# Patient Record
Sex: Female | Born: 1945 | Race: White | Hispanic: No | Smoking: Current every day smoker
Health system: Southern US, Community
[De-identification: ages and names within clinical notes are randomized; demographics above are authoritative.]

## PROBLEM LIST (undated history)

## (undated) DIAGNOSIS — Z860100 Personal history of colon polyps, unspecified: Secondary | ICD-10-CM

## (undated) DIAGNOSIS — K5792 Diverticulitis of intestine, part unspecified, without perforation or abscess without bleeding: Secondary | ICD-10-CM

## (undated) DIAGNOSIS — E079 Disorder of thyroid, unspecified: Secondary | ICD-10-CM

## (undated) DIAGNOSIS — R011 Cardiac murmur, unspecified: Secondary | ICD-10-CM

## (undated) DIAGNOSIS — E119 Type 2 diabetes mellitus without complications: Secondary | ICD-10-CM

## (undated) DIAGNOSIS — Z973 Presence of spectacles and contact lenses: Secondary | ICD-10-CM

## (undated) DIAGNOSIS — F32A Depression, unspecified: Secondary | ICD-10-CM

## (undated) DIAGNOSIS — Z87442 Personal history of urinary calculi: Secondary | ICD-10-CM

## (undated) DIAGNOSIS — I10 Essential (primary) hypertension: Secondary | ICD-10-CM

## (undated) DIAGNOSIS — M1990 Unspecified osteoarthritis, unspecified site: Secondary | ICD-10-CM

## (undated) HISTORY — DX: Depression, unspecified: F32.A

## (undated) HISTORY — DX: Personal history of colon polyps, unspecified: Z86.0100

## (undated) HISTORY — DX: Personal history of colonic polyps: Z86.010

## (undated) HISTORY — PX: TONSILLECTOMY: SUR1361

## (undated) HISTORY — PX: ABDOMINAL HYSTERECTOMY: SHX81

## (undated) HISTORY — PX: CHOLECYSTECTOMY: SHX55

---

## 2001-10-11 HISTORY — PX: CHOLECYSTECTOMY: SHX55

## 2001-11-29 ENCOUNTER — Emergency Department (HOSPITAL_COMMUNITY): Admission: EM | Admit: 2001-11-29 | Discharge: 2001-11-29 | Payer: Self-pay | Admitting: *Deleted

## 2001-12-02 ENCOUNTER — Observation Stay (HOSPITAL_COMMUNITY): Admission: RE | Admit: 2001-12-02 | Discharge: 2001-12-03 | Payer: Self-pay | Admitting: General Surgery

## 2001-12-02 ENCOUNTER — Encounter (INDEPENDENT_AMBULATORY_CARE_PROVIDER_SITE_OTHER): Payer: Self-pay | Admitting: Specialist

## 2001-12-02 ENCOUNTER — Encounter: Payer: Self-pay | Admitting: General Surgery

## 2003-03-19 ENCOUNTER — Encounter: Payer: Self-pay | Admitting: Emergency Medicine

## 2003-03-19 ENCOUNTER — Emergency Department (HOSPITAL_COMMUNITY): Admission: EM | Admit: 2003-03-19 | Discharge: 2003-03-19 | Payer: Self-pay | Admitting: Emergency Medicine

## 2005-02-24 ENCOUNTER — Ambulatory Visit: Payer: Self-pay | Admitting: Orthopedic Surgery

## 2006-06-08 ENCOUNTER — Encounter (INDEPENDENT_AMBULATORY_CARE_PROVIDER_SITE_OTHER): Payer: Self-pay | Admitting: *Deleted

## 2006-06-08 ENCOUNTER — Ambulatory Visit (HOSPITAL_COMMUNITY): Admission: RE | Admit: 2006-06-08 | Discharge: 2006-06-08 | Payer: Self-pay | Admitting: Gastroenterology

## 2009-09-16 ENCOUNTER — Encounter: Admission: RE | Admit: 2009-09-16 | Discharge: 2009-10-08 | Payer: Self-pay | Admitting: Family Medicine

## 2010-11-01 ENCOUNTER — Encounter: Payer: Self-pay | Admitting: Gastroenterology

## 2011-02-26 NOTE — Op Note (Signed)
[name], [name]                 ACCOUNT NO.:  [id number]   MEDICAL RECORD NO.:  [id number]          PATIENT TYPE:  AMB   LOCATION:  ENDO                         FACILITY:  [facility]   PHYSICIAN:  Anselmo Rod, M.D.  DATE OF BIRTH:  [birth year]   DATE OF PROCEDURE:  06/08/2006  DATE OF DISCHARGE:                                 OPERATIVE REPORT   PROCEDURE PERFORMED:  Colonoscopy with cold biopsies x4.  .   ENDOSCOPIST:  Anselmo Rod, M.D.   INSTRUMENT USED:  Olympus video colonoscope.   INDICATIONS FOR PROCEDURE:  A 65 year old white female underwent a screening  colonoscopy.  The patient has a history of colon cancer in her father  diagnosed at age 74 and has had recent change in bowel habits with mucoid  stools and abdominal pain.  Rule out colonic polyps, masses, etc.  She is  presumed to have diverticulitis.   PRE-PROCEDURE PREPARATION:  Informed consent was procured from the patient.  The patient was fasted for four hours prior to the procedure and prepped  with 32 OsmoPrep pills, 20 of which were given the night prior to the  procedure and 12 the morning of the procedure.  Risks and benefits of the  procedure were discussed with the patient in great detail.  A 10% missed  rate of cancer and polyps were discussed with her as well.   PRE-PROCEDURE PHYSICAL EXAMINATION:  VITAL SIGNS:  Stable vital signs.  NECK:  Supple.  CHEST:  Clear to auscultation.  HEART:  S1 and S2.  Regular.  ABDOMEN:  Soft with normal bowel sounds.   DESCRIPTION OF PROCEDURE:  The patient was placed in the left lateral  decubitus position and sedated with 150 mcg of fentanyl and 10 mg of Versed  in slow, incremental doses.  Once the patient was adequately sedated and  maintained on low flow oxygen and continuous cardiac monitoring, the Olympus  video colonoscope was advanced from the rectum to the cecum.  The appendicle  orifices and ileocecal valve were clearly visualized and photographed.  Multiple washings were done.  The terminal ileum appeared healthy and  without lesions.  The patient's position was changed from the left lateral  to supine and the right lateral position to reach the cecal base.  Because  of the patient's body habitus, the procedure was technically difficult.  Three small sessile polyps were biopsied from the rectum (cold biopsies x4).  The patient tolerated the procedure well without complications.   IMPRESSION:  1. Small nonbleeding internal hemorrhoid.  2. Three small sessile polyps biopsied from the rectum.  3. No evidence of diverticulosis.  4. Otherwise normal exam up to the terminal ileum.   RECOMMENDATIONS:  1. Await pathology results.  2. An abdominal ultrasound and transvaginal ultrasound will be done to      rule out ovarian pathology.  3. Avoid all nonsteroidals, including aspirin, for the next two weeks.  4. Repeat colonoscopy, depending on pathology results.  5. Outpatient followup after the ultrasound has been done for further      recommendations.  Anselmo Rod, M.D.  Electronically Signed     JNM/MEDQ  D:  06/08/2006  T:  06/08/2006  Job:  [id number]   cc:   Teena Irani. Arlyce Dice, M.D.

## 2014-07-09 ENCOUNTER — Other Ambulatory Visit: Payer: Self-pay

## 2014-12-17 ENCOUNTER — Other Ambulatory Visit (HOSPITAL_COMMUNITY): Payer: Self-pay | Admitting: Family Medicine

## 2014-12-17 ENCOUNTER — Ambulatory Visit (HOSPITAL_COMMUNITY)
Admission: RE | Admit: 2014-12-17 | Discharge: 2014-12-17 | Disposition: A | Discharge status: Home / Self Care | Payer: PPO | Source: Ambulatory Visit | Attending: Family Medicine | Admitting: Family Medicine

## 2014-12-17 ENCOUNTER — Other Ambulatory Visit: Payer: Self-pay | Admitting: Family Medicine

## 2014-12-17 DIAGNOSIS — R10813 Right lower quadrant abdominal tenderness: Secondary | ICD-10-CM

## 2014-12-17 DIAGNOSIS — R10829 Rebound abdominal tenderness, unspecified site: Secondary | ICD-10-CM

## 2014-12-17 DIAGNOSIS — R198 Other specified symptoms and signs involving the digestive system and abdomen: Secondary | ICD-10-CM

## 2014-12-17 LAB — POCT I-STAT CREATININE: Creatinine, Ser: 0.9 mg/dL (ref 0.50–1.10)

## 2014-12-17 MED ORDER — IOHEXOL 300 MG/ML  SOLN
50.0000 mL | Freq: Once | INTRAMUSCULAR | Status: AC | PRN
  Administered 2014-12-17: 50 mL via ORAL

## 2014-12-17 MED ORDER — IOHEXOL 300 MG/ML  SOLN
100.0000 mL | Freq: Once | INTRAMUSCULAR | Status: AC | PRN
  Administered 2014-12-17: 100 mL via INTRAVENOUS

## 2014-12-25 ENCOUNTER — Other Ambulatory Visit (HOSPITAL_COMMUNITY): Payer: Self-pay | Admitting: Family Medicine

## 2014-12-25 DIAGNOSIS — N2889 Other specified disorders of kidney and ureter: Secondary | ICD-10-CM

## 2015-01-03 ENCOUNTER — Ambulatory Visit (HOSPITAL_COMMUNITY)
Admission: RE | Admit: 2015-01-03 | Discharge: 2015-01-03 | Disposition: A | Discharge status: Home / Self Care | Payer: PPO | Source: Ambulatory Visit | Attending: Family Medicine | Admitting: Family Medicine

## 2015-01-03 DIAGNOSIS — N2889 Other specified disorders of kidney and ureter: Secondary | ICD-10-CM | POA: Diagnosis present

## 2015-01-03 MED ORDER — GADOBENATE DIMEGLUMINE 529 MG/ML IV SOLN
20.0000 mL | Freq: Once | INTRAVENOUS | Status: AC | PRN
  Administered 2015-01-03: 20 mL via INTRAVENOUS

## 2015-08-04 ENCOUNTER — Other Ambulatory Visit: Payer: Self-pay | Admitting: Family Medicine

## 2015-08-04 DIAGNOSIS — E2839 Other primary ovarian failure: Secondary | ICD-10-CM

## 2015-09-03 ENCOUNTER — Other Ambulatory Visit: Payer: PPO

## 2015-10-09 ENCOUNTER — Ambulatory Visit
Admission: RE | Admit: 2015-10-09 | Discharge: 2015-10-09 | Disposition: A | Discharge status: Home / Self Care | Payer: PPO | Source: Ambulatory Visit | Attending: Family Medicine | Admitting: Family Medicine

## 2015-10-09 DIAGNOSIS — E2839 Other primary ovarian failure: Secondary | ICD-10-CM

## 2015-10-17 DIAGNOSIS — E139 Other specified diabetes mellitus without complications: Secondary | ICD-10-CM | POA: Diagnosis not present

## 2015-10-17 DIAGNOSIS — E1165 Type 2 diabetes mellitus with hyperglycemia: Secondary | ICD-10-CM | POA: Diagnosis not present

## 2015-10-17 DIAGNOSIS — E039 Hypothyroidism, unspecified: Secondary | ICD-10-CM | POA: Diagnosis not present

## 2015-10-17 DIAGNOSIS — E7800 Pure hypercholesterolemia, unspecified: Secondary | ICD-10-CM | POA: Diagnosis not present

## 2015-11-28 DIAGNOSIS — F3341 Major depressive disorder, recurrent, in partial remission: Secondary | ICD-10-CM | POA: Insufficient documentation

## 2015-11-28 DIAGNOSIS — E039 Hypothyroidism, unspecified: Secondary | ICD-10-CM | POA: Insufficient documentation

## 2015-11-28 DIAGNOSIS — E669 Obesity, unspecified: Secondary | ICD-10-CM | POA: Insufficient documentation

## 2015-11-28 DIAGNOSIS — M5430 Sciatica, unspecified side: Secondary | ICD-10-CM | POA: Insufficient documentation

## 2015-11-28 DIAGNOSIS — M6790 Unspecified disorder of synovium and tendon, unspecified site: Secondary | ICD-10-CM | POA: Insufficient documentation

## 2015-11-28 DIAGNOSIS — N2889 Other specified disorders of kidney and ureter: Secondary | ICD-10-CM | POA: Insufficient documentation

## 2015-11-28 DIAGNOSIS — F17200 Nicotine dependence, unspecified, uncomplicated: Secondary | ICD-10-CM | POA: Insufficient documentation

## 2015-11-28 DIAGNOSIS — B349 Viral infection, unspecified: Secondary | ICD-10-CM | POA: Diagnosis not present

## 2016-01-29 DIAGNOSIS — R194 Change in bowel habit: Secondary | ICD-10-CM | POA: Diagnosis not present

## 2016-01-29 DIAGNOSIS — K219 Gastro-esophageal reflux disease without esophagitis: Secondary | ICD-10-CM | POA: Diagnosis not present

## 2016-01-29 DIAGNOSIS — Z1211 Encounter for screening for malignant neoplasm of colon: Secondary | ICD-10-CM | POA: Diagnosis not present

## 2016-01-29 DIAGNOSIS — R197 Diarrhea, unspecified: Secondary | ICD-10-CM | POA: Diagnosis not present

## 2016-01-29 DIAGNOSIS — E669 Obesity, unspecified: Secondary | ICD-10-CM | POA: Diagnosis not present

## 2016-03-15 DIAGNOSIS — E1165 Type 2 diabetes mellitus with hyperglycemia: Secondary | ICD-10-CM | POA: Diagnosis not present

## 2016-03-16 DIAGNOSIS — E039 Hypothyroidism, unspecified: Secondary | ICD-10-CM | POA: Diagnosis not present

## 2016-03-16 DIAGNOSIS — E7800 Pure hypercholesterolemia, unspecified: Secondary | ICD-10-CM | POA: Diagnosis not present

## 2016-03-16 DIAGNOSIS — E1165 Type 2 diabetes mellitus with hyperglycemia: Secondary | ICD-10-CM | POA: Diagnosis not present

## 2016-03-16 DIAGNOSIS — E139 Other specified diabetes mellitus without complications: Secondary | ICD-10-CM | POA: Diagnosis not present

## 2016-03-25 DIAGNOSIS — R053 Chronic cough: Secondary | ICD-10-CM | POA: Insufficient documentation

## 2016-03-25 DIAGNOSIS — R05 Cough: Secondary | ICD-10-CM | POA: Diagnosis not present

## 2016-04-28 DIAGNOSIS — R197 Diarrhea, unspecified: Secondary | ICD-10-CM | POA: Diagnosis not present

## 2016-04-28 DIAGNOSIS — R05 Cough: Secondary | ICD-10-CM | POA: Diagnosis not present

## 2016-05-25 ENCOUNTER — Institutional Professional Consult (permissible substitution): Payer: PPO | Admitting: Internal Medicine

## 2016-06-09 DIAGNOSIS — Z1211 Encounter for screening for malignant neoplasm of colon: Secondary | ICD-10-CM | POA: Diagnosis not present

## 2016-06-09 DIAGNOSIS — Z800 Family history of malignant neoplasm of digestive organs: Secondary | ICD-10-CM | POA: Diagnosis not present

## 2016-06-09 DIAGNOSIS — K635 Polyp of colon: Secondary | ICD-10-CM | POA: Diagnosis not present

## 2016-06-09 DIAGNOSIS — D125 Benign neoplasm of sigmoid colon: Secondary | ICD-10-CM | POA: Diagnosis not present

## 2016-07-08 DIAGNOSIS — E1165 Type 2 diabetes mellitus with hyperglycemia: Secondary | ICD-10-CM | POA: Diagnosis not present

## 2016-07-08 DIAGNOSIS — E7800 Pure hypercholesterolemia, unspecified: Secondary | ICD-10-CM | POA: Diagnosis not present

## 2016-07-08 DIAGNOSIS — E039 Hypothyroidism, unspecified: Secondary | ICD-10-CM | POA: Diagnosis not present

## 2016-07-12 IMAGING — CT CT ABD-PELV W/ CM
2 of 5 series · 16 of 46 positions shown, 18 images · IV contrast (omnipaque)
Comparison: None available.

CLINICAL DATA: 68-year-old diabetic female with abdominal tendinous
in the right lower quadrant for past day. Question appendicitis.
Prior cholecystectomy. Initial encounter.

EXAM:
CT ABDOMEN AND PELVIS WITH CONTRAST
TECHNIQUE: Multidetector CT imaging of the abdomen and pelvis was performed
using the standard protocol following bolus administration of
intravenous contrast.
CONTRAST:  100mL OMNIPAQUE IOHEXOL 300 MG/ML  SOLN

[Series 2: rtn ap with st · axial · 0.88mm/px · z∈[-495,-90]mm · 13 of 91 slices shown, 15 images]
[im 5/91  soft-tissue]
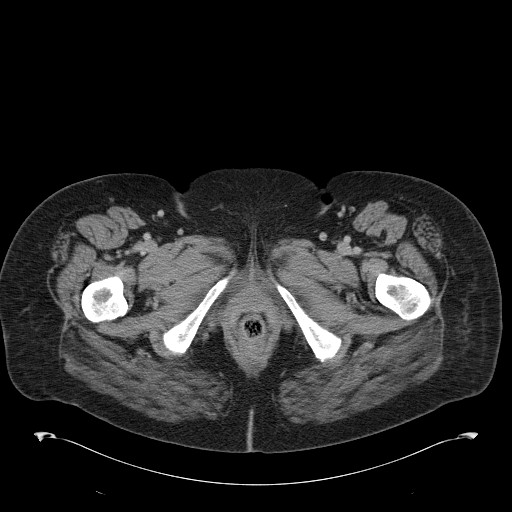
[im 5/91  bone]
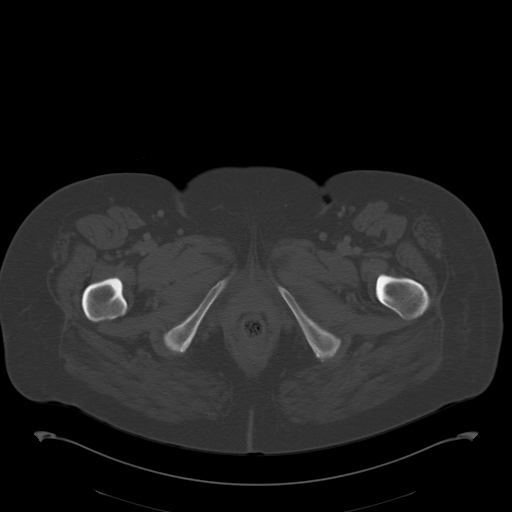
[im 15/91  soft-tissue]
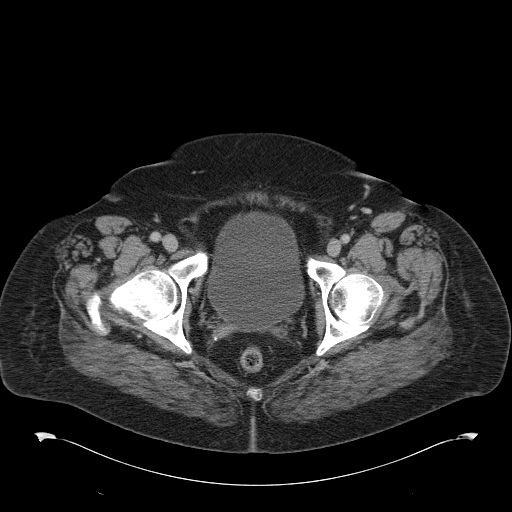
[im 19/91  soft-tissue]
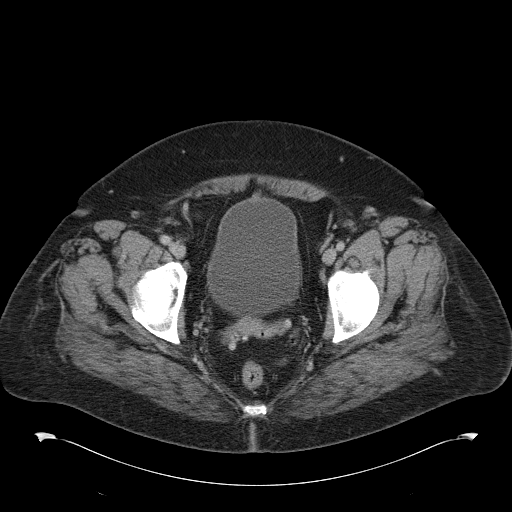
[im 24/91  soft-tissue]
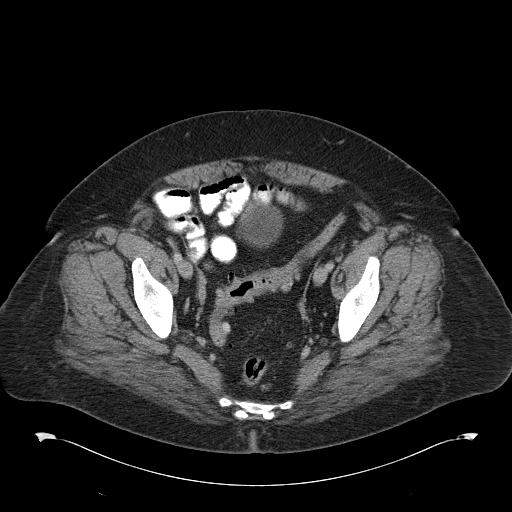
[im 34/91  soft-tissue]
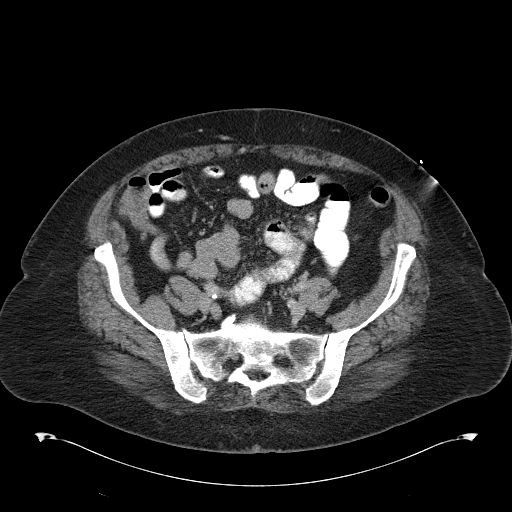
[im 38/91  soft-tissue]
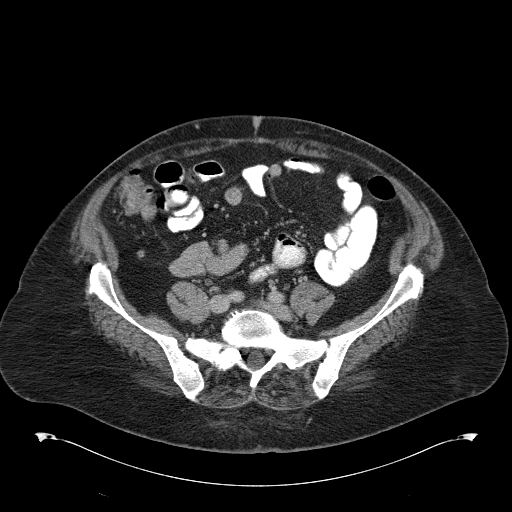
[im 48/91  soft-tissue]
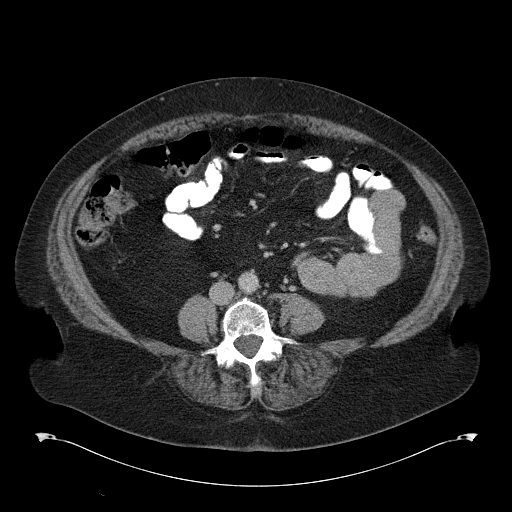
[im 53/91  soft-tissue]
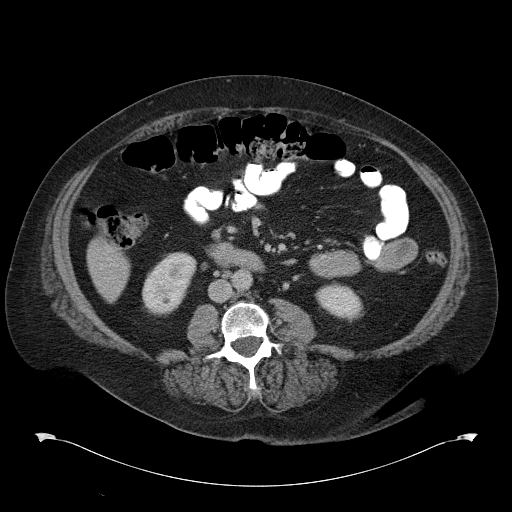
[im 57/91  soft-tissue]
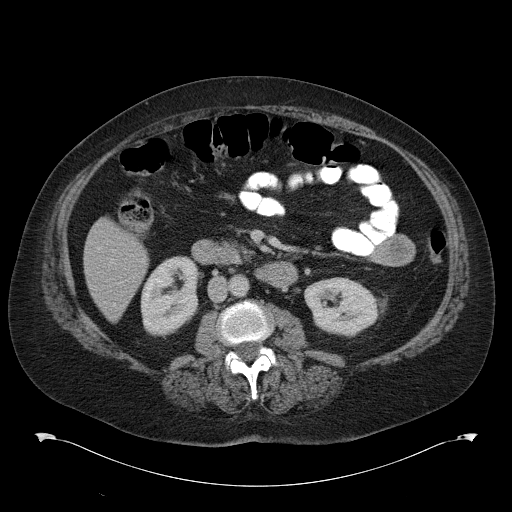
[im 57/91  bone]
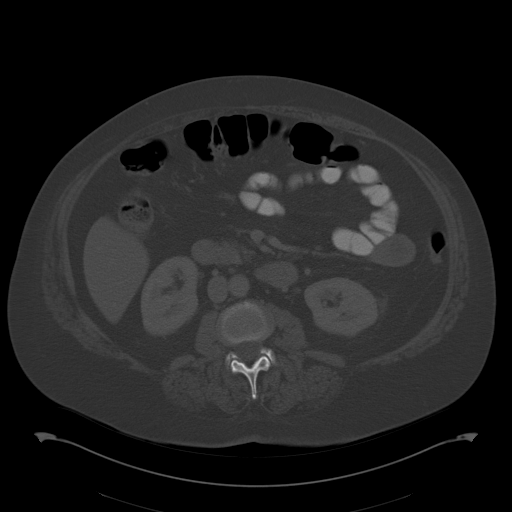
[im 67/91  soft-tissue]
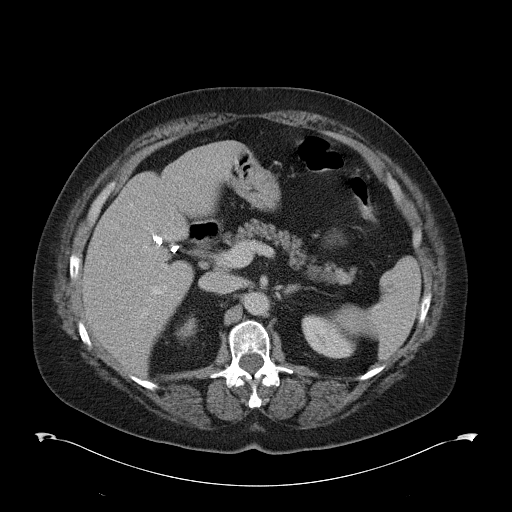
[im 72/91  soft-tissue]
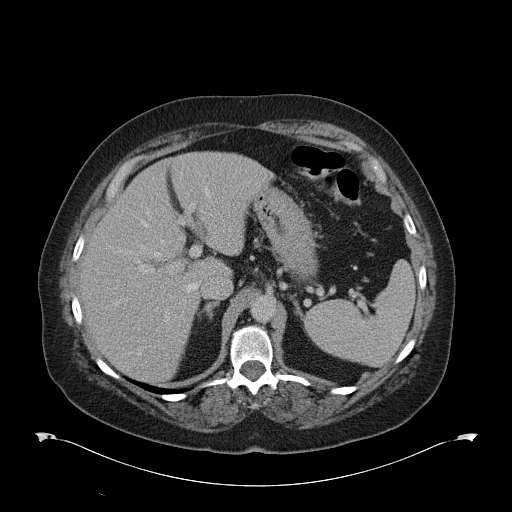
[im 76/91  soft-tissue]
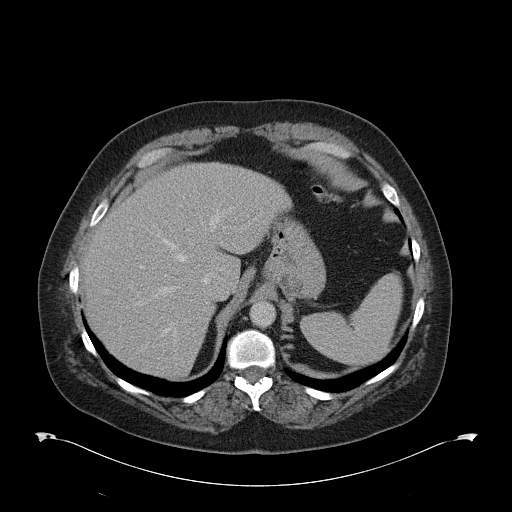
[im 86/91  soft-tissue]
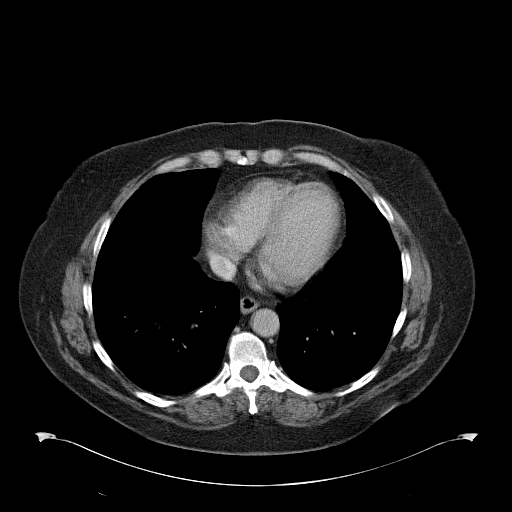

[Series 602: coronals · coronal · 0.92mm/px · 3 of 158 slices shown]
[im 53/158  soft-tissue]
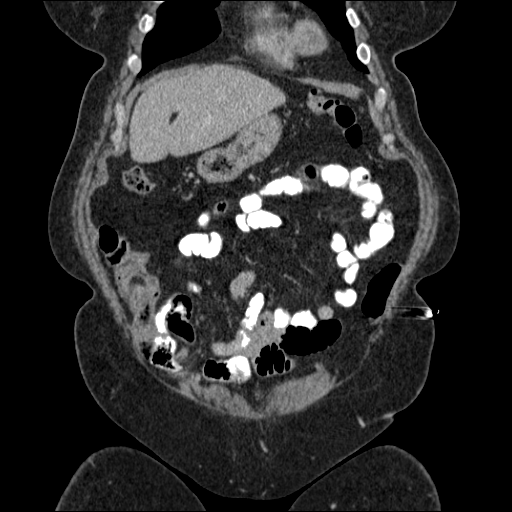
[im 70/158  soft-tissue]
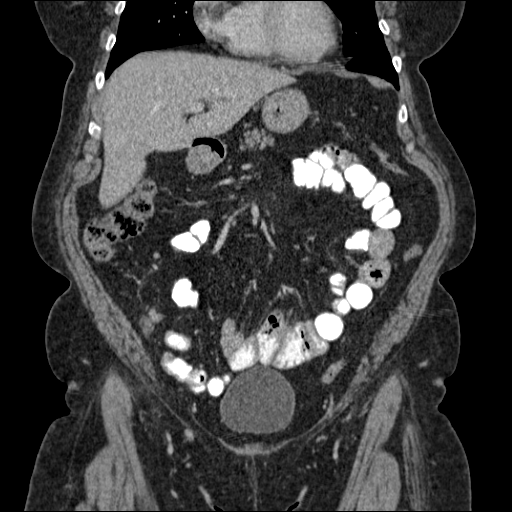
[im 88/158  soft-tissue]
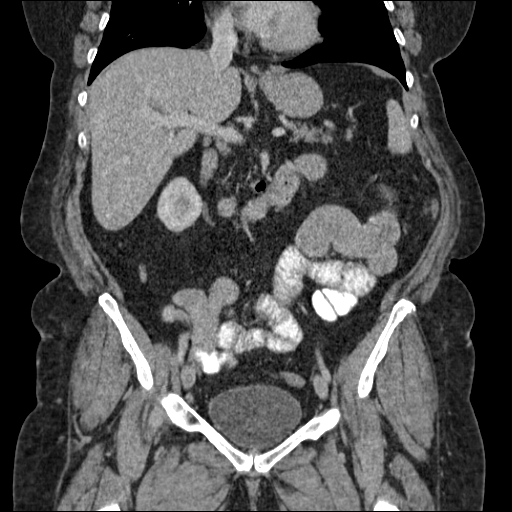

[16 of 46 positions shown; findings below may reference images not displayed]

FINDINGS: Colonic diverticula most notable sigmoid colon. No extra luminal
bowel inflammatory process, free fluid or free air. Specifically, no
inflammation surrounds the appendix or terminal ileum.

Right renal 1.1 cm partially exophytic lesion does not have
characteristics of a simple cyst. Dedicated pre and postcontrast MR
of the kidneys recommended for further delineation to determine if
this represents a suspicious mass. 5 mm right lower pole
angiomyelolipoma suspect.

7.9 mm splenic artery aneurysm suspected.

Mild fatty infiltration of the liver without worrisome focal hepatic
lesion. Post cholecystectomy. No worrisome splenic, pancreatic, left
renal or adrenal abnormality.

Mild atherosclerotic type changes of the abdominal aorta are without
aneurysmal dilation.

Post hysterectomy.

Lung bases clear.

No adenopathy.

No bowel containing hernia.

Mild degenerative changes lower thoracic and lumbar spine most
notable L5-S1.

Non contrast filled views of the urinary bladder unremarkable.
IMPRESSION: Colonic diverticula most notable sigmoid colon. No extra luminal
bowel inflammatory process, free fluid or free air. Specifically, no
inflammation surrounds the appendix or terminal ileum.

Right renal 1.1 cm partially exophytic lesion does not have
characteristics of a simple cyst. Dedicated pre and postcontrast MR
of the kidneys recommended for further delineation to determine if
this represents a suspicious mass. 5 mm right lower pole
angiomyelolipoma suspect.

7.9 mm splenic artery aneurysm suspected.

These results will be called to the ordering clinician or
representative by the Radiologist Assistant, and communication
documented in the PACS or zVision Dashboard.

## 2016-07-15 DIAGNOSIS — E039 Hypothyroidism, unspecified: Secondary | ICD-10-CM | POA: Diagnosis not present

## 2016-07-15 DIAGNOSIS — E139 Other specified diabetes mellitus without complications: Secondary | ICD-10-CM | POA: Diagnosis not present

## 2016-07-15 DIAGNOSIS — E7800 Pure hypercholesterolemia, unspecified: Secondary | ICD-10-CM | POA: Diagnosis not present

## 2016-07-15 DIAGNOSIS — Z23 Encounter for immunization: Secondary | ICD-10-CM | POA: Diagnosis not present

## 2016-07-15 DIAGNOSIS — E1165 Type 2 diabetes mellitus with hyperglycemia: Secondary | ICD-10-CM | POA: Diagnosis not present

## 2016-09-03 ENCOUNTER — Ambulatory Visit (HOSPITAL_COMMUNITY)
Admission: EM | Admit: 2016-09-03 | Discharge: 2016-09-03 | Disposition: A | Discharge status: Home / Self Care | Payer: PPO | Attending: Emergency Medicine | Admitting: Emergency Medicine

## 2016-09-03 ENCOUNTER — Encounter (HOSPITAL_COMMUNITY): Payer: Self-pay

## 2016-09-03 DIAGNOSIS — M79671 Pain in right foot: Secondary | ICD-10-CM | POA: Diagnosis not present

## 2016-09-03 HISTORY — DX: Disorder of thyroid, unspecified: E07.9

## 2016-09-03 HISTORY — DX: Essential (primary) hypertension: I10

## 2016-09-03 HISTORY — DX: Type 2 diabetes mellitus without complications: E11.9

## 2016-09-03 NOTE — ED Provider Notes (Signed)
[place]    CSN: [id number] Arrival date & time: 09/03/16  1153     History   Chief Complaint Chief Complaint  Patient presents with  . Foot Pain    HPI Patricia Mills is a 70 y.o. female.   HPI  She is a 70 year old woman here for evaluation of right foot pain. This started 3 days ago. She denies any injury or trauma. Pain is located in the forefoot, over the metatarsals. She describes it as a pressure sensation. The pain will sometimes radiate up the back of her leg to the knee. She does have some swelling in the right leg.  Past Medical History:  Diagnosis Date  . Diabetes mellitus without complication ([place])   . Hypertension   . Thyroid disease     There are no active problems to display for this patient.   Past Surgical History:  Procedure Laterality Date  . ABDOMINAL HYSTERECTOMY    . CHOLECYSTECTOMY    . TONSILLECTOMY      OB History    No data available       Home Medications    Prior to Admission medications   Medication Sig Start Date End Date Taking? Authorizing Provider  aspirin 81 MG chewable tablet Chew by mouth daily.   Yes Historical Provider, MD  esomeprazole (NEXIUM) 20 MG packet Take 20 mg by mouth daily before breakfast.   Yes Historical Provider, MD  insulin NPH-regular Human (NOVOLIN 70/30) (70-30) 100 UNIT/ML injection Inject into the skin.   Yes Historical Provider, MD  levothyroxine (SYNTHROID, LEVOTHROID) 25 MCG tablet Take 25 mcg by mouth daily before breakfast.   Yes Historical Provider, MD  LORazepam (ATIVAN) 0.5 MG tablet Take 0.5 mg by mouth every 8 (eight) hours.   Yes Historical Provider, MD  losartan (COZAAR) 50 MG tablet Take 50 mg by mouth daily.   Yes Historical Provider, MD  sertraline (ZOLOFT) 50 MG tablet Take 50 mg by mouth daily.   Yes Historical Provider, MD  sitaGLIPtin-metformin (JANUMET) 50-500 MG tablet Take 1 tablet by mouth 2 (two) times daily with a meal.   Yes Historical Provider, MD    Family  History No family history on file.  Social History Social History  Substance Use Topics  . Smoking status: Current Every Day Smoker    Packs/day: 0.50    Years: 20.00    Types: Cigarettes  . Smokeless tobacco: Never Used  . Alcohol use No     Allergies   Patient has no known allergies.   Review of Systems Review of Systems As in history of present illness  Physical Exam Triage Vital Signs ED Triage Vitals  Enc Vitals Group     BP 09/03/16 1312 151/73     Pulse Rate 09/03/16 1312 70     Resp 09/03/16 1312 16     Temp 09/03/16 1312 98.1 F (36.7 C)     Temp Source 09/03/16 1312 Oral     SpO2 09/03/16 1312 95 %     Weight --      Height --      Head Circumference --      Peak Flow --      Pain Score 09/03/16 1316 5     Pain Loc --      Pain Edu? --      Excl. in [place]? --    No data found.   Updated Vital Signs BP 151/73 (BP Location: Left Arm)   Pulse 70  Temp 98.1 F (36.7 C) (Oral)   Resp 16   SpO2 95%   Visual Acuity Right Eye Distance:   Left Eye Distance:   Bilateral Distance:    Right Eye Near:   Left Eye Near:    Bilateral Near:     Physical Exam  Constitutional: She is oriented to person, place, and time. She appears well-developed and well-nourished. No distress.  Cardiovascular: Normal rate.   Pulmonary/Chest: Effort normal.  Musculoskeletal:  Right foot: 2+ DP pulse. No swelling of the foot. She is tender over the plantar aspect of the metatarsals. Possible loss of the metatarsal arch. 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 Right leg: There is some nonpitting edema, most prominently at the ankle. No calf tenderness.  Neurological: She is alert and oriented to person, place, and time.     UC Treatments / Results  Labs (all labs ordered are listed, but only abnormal results are displayed) Labs Reviewed - No data to display  EKG  EKG Interpretation None       Radiology No results  found.  Procedures Procedures (including critical care time)  Medications Ordered in UC Medications - No data to display   Initial Impression / Assessment and Plan / UC Course  I have reviewed the triage vital signs and the nursing notes.  Pertinent labs & imaging results that were available during my care of the patient were reviewed by me and considered in my medical decision making (see chart for details).  Clinical Course     No sign of this being vascular or DVT. Symptomatic treatment with Tylenol at home. Follow-up with PCP next week.   Final Clinical Impressions(s) / UC Diagnoses   Final diagnoses:  Right foot pain    New Prescriptions Discharge Medication List as of 09/03/2016  2:18 PM       Melony Overly, MD 09/03/16 1442

## 2016-09-03 NOTE — Discharge Instructions (Signed)
I do not see any sign of blood clot or vascular problems. Make sure you wear a supportive shoe. Keep your foot elevated as much as possible. Apply ice to 3 times a day. Take Tylenol as needed for pain. Follow-up with your regular doctor next week.

## 2016-09-03 NOTE — ED Triage Notes (Signed)
Pt said since Tuesday she is having right foot pain radiating up her leg and seems to be getting worse. Has a hx of swelling in that leg but did not injure it or fall. Did elevate it on wed and Thursday and it did help the pressure feeling. Describes it as a sharp pain in her foot.

## 2016-09-14 DIAGNOSIS — R194 Change in bowel habit: Secondary | ICD-10-CM | POA: Diagnosis not present

## 2016-09-14 DIAGNOSIS — R152 Fecal urgency: Secondary | ICD-10-CM | POA: Diagnosis not present

## 2016-09-14 DIAGNOSIS — R109 Unspecified abdominal pain: Secondary | ICD-10-CM | POA: Diagnosis not present

## 2016-09-14 DIAGNOSIS — K219 Gastro-esophageal reflux disease without esophagitis: Secondary | ICD-10-CM | POA: Diagnosis not present

## 2016-09-14 DIAGNOSIS — Z7689 Persons encountering health services in other specified circumstances: Secondary | ICD-10-CM | POA: Diagnosis not present

## 2016-09-14 DIAGNOSIS — R197 Diarrhea, unspecified: Secondary | ICD-10-CM | POA: Diagnosis not present

## 2016-09-15 DIAGNOSIS — R197 Diarrhea, unspecified: Secondary | ICD-10-CM | POA: Diagnosis not present

## 2016-10-29 DIAGNOSIS — M25511 Pain in right shoulder: Secondary | ICD-10-CM | POA: Diagnosis not present

## 2016-11-08 DIAGNOSIS — F419 Anxiety disorder, unspecified: Secondary | ICD-10-CM | POA: Diagnosis not present

## 2016-11-08 DIAGNOSIS — I10 Essential (primary) hypertension: Secondary | ICD-10-CM | POA: Diagnosis not present

## 2017-04-08 DIAGNOSIS — S61419A Laceration without foreign body of unspecified hand, initial encounter: Secondary | ICD-10-CM | POA: Diagnosis not present

## 2017-04-12 DIAGNOSIS — M7062 Trochanteric bursitis, left hip: Secondary | ICD-10-CM | POA: Diagnosis not present

## 2017-05-13 DIAGNOSIS — B029 Zoster without complications: Secondary | ICD-10-CM | POA: Diagnosis not present

## 2017-07-05 DIAGNOSIS — E039 Hypothyroidism, unspecified: Secondary | ICD-10-CM | POA: Diagnosis not present

## 2017-07-05 DIAGNOSIS — I10 Essential (primary) hypertension: Secondary | ICD-10-CM | POA: Diagnosis not present

## 2017-07-05 DIAGNOSIS — E785 Hyperlipidemia, unspecified: Secondary | ICD-10-CM | POA: Diagnosis not present

## 2017-07-05 DIAGNOSIS — Z23 Encounter for immunization: Secondary | ICD-10-CM | POA: Diagnosis not present

## 2017-07-05 DIAGNOSIS — E119 Type 2 diabetes mellitus without complications: Secondary | ICD-10-CM | POA: Diagnosis not present

## 2017-07-15 DIAGNOSIS — Z1231 Encounter for screening mammogram for malignant neoplasm of breast: Secondary | ICD-10-CM | POA: Diagnosis not present

## 2017-08-11 DIAGNOSIS — M7062 Trochanteric bursitis, left hip: Secondary | ICD-10-CM | POA: Diagnosis not present

## 2017-08-17 DIAGNOSIS — M7062 Trochanteric bursitis, left hip: Secondary | ICD-10-CM | POA: Diagnosis not present

## 2017-08-24 ENCOUNTER — Ambulatory Visit: Payer: PPO | Attending: Orthopedic Surgery | Admitting: Physical Therapy

## 2017-08-24 DIAGNOSIS — M25552 Pain in left hip: Secondary | ICD-10-CM | POA: Insufficient documentation

## 2017-08-24 DIAGNOSIS — R262 Difficulty in walking, not elsewhere classified: Secondary | ICD-10-CM | POA: Insufficient documentation

## 2017-08-24 NOTE — Therapy (Signed)
[facility] [place], [place], [address] Phone: [phone]   Fax:  [phone]  Physical Therapy Evaluation  Patient Details  Name: Patricia Mills MRN: [id number] Date of Birth: 09-15-1946 Referring Provider: Netta Cedars, MD   Encounter Date: 08/24/2017  PT End of Session - 08/24/17 1246    Visit Number  1    Number of Visits  12    Date for PT Re-Evaluation  10/24/17    Authorization Type  Kx modifier at visit 15, G-codes every 10th visit    PT Start Time  1115    PT Stop Time  1210    PT Time Calculation (min)  55 min    Activity Tolerance  Patient tolerated treatment well    Behavior During Therapy  [facility] for tasks assessed/performed       Past Medical History:  Diagnosis Date  . Diabetes mellitus without complication ([place])   . Hypertension   . Thyroid disease     Past Surgical History:  Procedure Laterality Date  . ABDOMINAL HYSTERECTOMY    . CHOLECYSTECTOMY    . TONSILLECTOMY      There were no vitals filed for this visit.   Subjective Assessment - 08/24/17 1043    Subjective  Pt arriving to therpay reporting pain of 8/10 in left hip. pt reports that her pain limits her sleeping and ADL's. Pt reporting the pain has been going on for years but has worsened over the last 6 months. Pt with dx of spinal stenosis and hip OA.     Pertinent History  DM    Limitations  House hold activities;Walking    How long can you stand comfortably?  30 minutes    How long can you walk comfortably?  10 minutes    Currently in Pain?  Yes    Pain Score  8     Pain Location  Hip    Pain Orientation  Left    Pain Descriptors / Indicators  Stabbing    Pain Type  Chronic pain    Pain Onset  More than a month ago    Pain Frequency  Constant    Aggravating Factors   walking, sleeping is difficutly, lifting    Pain Relieving Factors  resting, ice, soak in episom salt         [facility] PT Assessment - 08/24/17 0001       Assessment   Medical Diagnosis  L hip pain    Referring Provider  Netta Cedars, MD    Hand Dominance  Right    Prior Therapy  no      Precautions   Precautions  None      Restrictions   Weight Bearing Restrictions  No      Balance Screen   Has the patient fallen in the past 6 months  No    Is the patient reluctant to leave their home because of a fear of falling?   No      Home Environment   Living Environment  Private residence    Living Arrangements  Alone    Available Help at Discharge  Family    Type of [place] to enter    Entrance Stairs-Number of Steps  2    Entrance Stairs-Rails  Cannot reach both    [place]  One level    [place]  None      Prior Function  Level of Independence  Independent    Vocation  Retired    [employer]  still works occassionally as an Optometrist    Leisure  loves to read      Cognition   Overall Cognitive Status  Within Functional Limits for tasks assessed      Observation/Other Assessments   Focus on Therapeutic Outcomes (FOTO)   62% limitation      Posture/Postural Control   Posture/Postural Control  Postural limitations    Postural Limitations  Rounded Shoulders;Forward head      ROM / Strength   AROM / PROM / Strength  AROM;Strength      AROM   AROM Assessment Site  Hip    Right/Left Hip  Left    Left Hip Extension  15 limited by pain    Left Hip Flexion  100      Strength   Overall Strength Comments  L LE grossly 4/5, R LE grossy 5/5      Flexibility   Soft Tissue Assessment /Muscle Length  yes    Hamstrings  L: 45 degrees      Transfers   Five time sit to stand comments   19.1 seconds using UE support             Objective measurements completed on examination: See above findings.      [facility] Adult PT Treatment/Exercise - 08/24/17 0001      Modalities   Modalities  Electrical Stimulation;Moist Heat      Moist Heat Therapy   Number Minutes Moist Heat  15  Minutes    Moist Heat Location  Hip      Electrical Stimulation   Electrical Stimulation Location  left hip    Electrical Stimulation Action  IFC: 80-150 Hz x 15 minutes, intensity to pt's tolerance    Electrical Stimulation Goals  Pain      Manual Therapy   Manual therapy comments  Pt with tenderness to palpation over left IT band, left piriformis, and lateral hip musculature              PT Education - 08/24/17 1130    Education provided  Yes    Education Details  HEP    Person(s) Educated  Patient    Methods  Explanation          PT Long Term Goals - 08/24/17 1257      PT LONG TERM GOAL #1   Title  Pt will improve her FOTO from 67% limitation to </= 47% limitation.     Time  6    Period  Weeks    Status  New    Target Date  10/10/17      PT LONG TERM GOAL #2   Title  Pt will be independent in her HEP and progression.     Time  6    Period  Weeks    Status  New    Target Date  10/10/17      PT LONG TERM GOAL #3   Title  Pt will be able to amb with pain </= 3/10 for >/= 20 minutes.     Time  6    Period  Weeks    Status  New    Target Date  10/10/17      PT LONG TERM GOAL #4   Title  Pt will be able to report > 50% improvement in sleep.     Time  6  Period  Weeks    Status  New    Target Date  10/10/17      PT LONG TERM GOAL #5   Title  Pt will be able to improve her L LE strength to 5/5 in order to improve functional mobility.     Time  6    Period  Weeks    Target Date  10/10/17             Plan - 08/26/2017 1250    Clinical Impression Statement  Patient arriving to therapy as a low complexity evalaution reporting chronic left hip pain with difficulty walking, sleeping and with ADL's. Pt reporting worsening pain over the last 6 months. Pt arriving today reporting 8/10 left hip pain. Pt with L LE weakness of 4/5 compared to 5/5 on the R LE. Pt also with limited hamstring flexibilty in bilateral LE's. Tenderness to palpation along IT band  and lateral left hip and pirformis. IT band stretching and Piriformis stretch perfomred and issued as a HEP. E-stim also applied with moist heat. Pt reporting 5/10 at end of session and reporting he hip hasn't felt that good in a while. Skilled PT needed to address pt's impairments with the below interventions.     History and Personal Factors relevant to plan of care:  Pt reporting recent hip injection which "did not help".    Clinical Presentation  Stable    Clinical Decision Making  Low    Rehab Potential  Excellent    PT Frequency  2x / week    PT Treatment/Interventions  ADLs/Self Care Home Management;Moist Heat;Electrical Stimulation;Cryotherapy;Ultrasound;Gait training;Stair training;Functional mobility training;Therapeutic activities;Therapeutic exercise;Manual techniques;Patient/family education;Passive range of motion;Dry needling;Taping    PT Next Visit Plan  Begin on Nustep, hip stretching and strengthening, E-stim and other modalities as needed.     PT Home Exercise Plan  IT band stretch, Piriformis stretch    Consulted and Agree with Plan of Care  Patient       Patient will benefit from skilled therapeutic intervention in order to improve the following deficits and impairments:  Abnormal gait, Pain, Decreased activity tolerance, Decreased strength, Decreased mobility, Difficulty walking, Decreased range of motion  Visit Diagnosis: Pain in left hip  Difficulty in walking, not elsewhere classified  G-Codes - August 26, 2017 1247    Functional Assessment Tool Used (Outpatient Only)  FOTO (62% limitation on Aug 26, 2017) , clinical assessment    Functional Limitation  Mobility: Walking and moving around    Mobility: Walking and Moving Around Current Status [phone])  At least 60 percent but less than 80 percent impaired, limited or restricted    Mobility: Walking and Moving Around Goal Status [phone])  At least 40 percent but less than 60 percent impaired, limited or restricted         Problem List There are no active problems to display for this patient.   Oretha Caprice, MPT 08/26/2017, 1:10 PM  [facility] [address] [place], [place], [address] Phone: [phone]   Fax:  [phone]  Name: Patricia Mills MRN: [id number] Date of Birth: 03-30-1946

## 2017-08-25 ENCOUNTER — Encounter: Payer: Self-pay | Admitting: Physical Therapy

## 2017-08-25 ENCOUNTER — Ambulatory Visit: Payer: PPO | Admitting: Physical Therapy

## 2017-08-25 DIAGNOSIS — M25552 Pain in left hip: Secondary | ICD-10-CM | POA: Diagnosis not present

## 2017-08-25 DIAGNOSIS — R262 Difficulty in walking, not elsewhere classified: Secondary | ICD-10-CM

## 2017-08-25 NOTE — Therapy (Signed)
[facility] [place], [place], [address] Phone: [phone]   Fax:  [phone]  Physical Therapy Treatment  Patient Details  Name: Patricia Mills MRN: [id number] Date of Birth: 01-07-1946 Referring Provider: Netta Cedars, MD   Encounter Date: 08/25/2017  PT End of Session - 08/25/17 1126    Visit Number  2    Number of Visits  12    Date for PT Re-Evaluation  10/24/17    Authorization Type  Kx modifier at visit 15, G-codes every 10th visit    PT Start Time  1030    PT Stop Time  1120    PT Time Calculation (min)  50 min       Past Medical History:  Diagnosis Date  . Diabetes mellitus without complication ([place])   . Hypertension   . Thyroid disease     Past Surgical History:  Procedure Laterality Date  . ABDOMINAL HYSTERECTOMY    . CHOLECYSTECTOMY    . TONSILLECTOMY      There were no vitals filed for this visit.  Subjective Assessment - 08/25/17 1119    Subjective  Pt arriving to therapy reporting pain of 6/10 in left hip. Pt reporting feeling so much better following yesterday's treatment. Pt reporting doing her HEP.     Pertinent History  DM    Limitations  House hold activities;Walking    Currently in Pain?  Yes    Pain Score  6     Pain Location  Hip    Pain Orientation  Left    Pain Descriptors / Indicators  Aching;Sore;Shooting    Pain Type  Chronic pain    Pain Onset  More than a month ago    Aggravating Factors   walking, sleeping is difficult    Pain Relieving Factors  resting, ice, soad in episom salt         [facility] PT Assessment - 08/25/17 0001      Assessment   Medical Diagnosis  L hip pain    Referring Provider  Netta Cedars, MD      Precautions   Precautions  None      Restrictions   Weight Bearing Restrictions  No                  [facility] Adult PT Treatment/Exercise - 08/25/17 0001      Posture/Postural Control   Posture/Postural Control  Postural limitations     Postural Limitations  Rounded Shoulders;Forward head      Exercises   Exercises  Knee/Hip      Knee/Hip Exercises: Stretches   Active Hamstring Stretch  Both;2 reps    ITB Stretch  3 reps;30 seconds    Piriformis Stretch  3 reps;30 seconds      Knee/Hip Exercises: Supine   Straight Leg Raises  Strengthening;15 reps      Modalities   Modalities  Electrical Stimulation;Moist Heat      Moist Heat Therapy   Number Minutes Moist Heat  15 Minutes    Moist Heat Location  Hip      Electrical Stimulation   Electrical Stimulation Location  left hip    Electrical Stimulation Action  IFC 80-150 Hz, intensity to tolerance    Electrical Stimulation Goals  Pain      Manual Therapy   Manual Therapy  Soft tissue mobilization    Soft tissue mobilization  STW to left gluteal region and trigger point release to left pirformis.  PT Education - 08/25/17 1126    Education provided  Yes    Education Details  Reviewed HEP    Person(s) Educated  Patient    Methods  Explanation;Demonstration    Comprehension  Verbalized understanding;Returned demonstration          PT Long Term Goals - 08/25/17 1132      PT LONG TERM GOAL #1   Title  Pt will improve her FOTO from 67% limitation to </= 47% limitation.     Time  6    Status  New      PT LONG TERM GOAL #2   Title  Pt will be independent in her HEP and progression.     Time  6    Period  Weeks    Status  New      PT LONG TERM GOAL #3   Title  Pt will be able to amb with pain </= 3/10 for >/= 20 minutes.     Period  Weeks    Status  New      PT LONG TERM GOAL #4   Title  Pt will be able to report > 50% improvement in sleep.     Time  6    Period  Weeks    Status  New      PT LONG TERM GOAL #5   Title  Pt will be able to improve her L LE strength to 5/5 in order to improve functional mobility.     Time  6    Period  Weeks            Plan - 08/25/17 1127    Clinical Impression Statement  Patient arriving  to therapy today reporting 6/10 pain. Pt reviewed her HEP, STW performed and E-stim. Pt reporting no pain at end of session. Continue skilled PT    Rehab Potential  Excellent    PT Frequency  2x / week    PT Treatment/Interventions  ADLs/Self Care Home Management;Moist Heat;Electrical Stimulation;Cryotherapy;Ultrasound;Gait training;Stair training;Functional mobility training;Therapeutic activities;Therapeutic exercise;Manual techniques;Patient/family education;Passive range of motion;Dry needling;Taping    PT Next Visit Plan  Begin on Nustep, hip stretching and strengthening, E-stim and other modalities as needed.     PT Home Exercise Plan  IT band stretch, Piriformis stretch    Consulted and Agree with Plan of Care  Patient       Patient will benefit from skilled therapeutic intervention in order to improve the following deficits and impairments:  Abnormal gait, Pain, Decreased activity tolerance, Decreased strength, Decreased mobility, Difficulty walking, Decreased range of motion  Visit Diagnosis: Pain in left hip  Difficulty in walking, not elsewhere classified   G-Codes - 08/30/17 1247    Functional Assessment Tool Used (Outpatient Only)  FOTO (62% limitation on 30-Aug-2017) , clinical assessment    Functional Limitation  Mobility: Walking and moving around    Mobility: Walking and Moving Around Current Status [phone]  At least 60 percent but less than 80 percent impaired, limited or restricted    Mobility: Walking and Moving Around Goal Status [id number]  At least 40 percent but less than 60 percent impaired, limited or restricted       Problem List There are no active problems to display for this patient.   Oretha Caprice, MPT 08/25/2017, 11:33 AM  [facility] [address] [place], [place], [address] Phone: [phone]   Fax:  [phone]  Name: ELLISA DEVIVO MRN: [id number] Date of Birth:  10/02/1946   

## 2017-08-29 ENCOUNTER — Ambulatory Visit: Payer: PPO | Admitting: Physical Therapy

## 2017-08-29 DIAGNOSIS — M25552 Pain in left hip: Secondary | ICD-10-CM | POA: Diagnosis not present

## 2017-08-29 DIAGNOSIS — R262 Difficulty in walking, not elsewhere classified: Secondary | ICD-10-CM

## 2017-08-29 NOTE — Therapy (Signed)
[facility] [place], [place], [address] Phone: [phone]   Fax:  [phone]  Physical Therapy Treatment  Patient Details  Name: Patricia Mills MRN: [id number] Date of Birth: 29-Jun-1946 Referring Provider: Netta Cedars, MD   Encounter Date: 08/29/2017  PT End of Session - 08/29/17 1224    Visit Number  3    Number of Visits  12    Date for PT Re-Evaluation  10/24/17    Authorization Type  Kx modifier at visit 15, G-codes every 10th visit    PT Start Time  0945    PT Stop Time  1035    PT Time Calculation (min)  50 min    Activity Tolerance  Patient tolerated treatment well    Behavior During Therapy  [facility] for tasks assessed/performed       Past Medical History:  Diagnosis Date  . Diabetes mellitus without complication ([place])   . Hypertension   . Thyroid disease     Past Surgical History:  Procedure Laterality Date  . ABDOMINAL HYSTERECTOMY    . CHOLECYSTECTOMY    . TONSILLECTOMY      There were no vitals filed for this visit.      [facility] PT Assessment - 08/29/17 0001      Assessment   Medical Diagnosis  L hip pain    Referring Provider  Netta Cedars, MD      Precautions   Precautions  None      Restrictions   Weight Bearing Restrictions  No      Balance Screen   Has the patient fallen in the past 6 months  No                  [facility] Adult PT Treatment/Exercise - 08/29/17 0001      Exercises   Exercises  Knee/Hip      Knee/Hip Exercises: Stretches   Active Hamstring Stretch  Both;2 reps    ITB Stretch  3 reps;30 seconds    Piriformis Stretch  3 reps;30 seconds      Knee/Hip Exercises: Supine   Bridges  Strengthening;15 reps    Straight Leg Raises  Strengthening;15 reps      Modalities   Modalities  Electrical Stimulation;Moist Heat      Moist Heat Therapy   Number Minutes Moist Heat  15 Minutes    Moist Heat Location  Hip      Electrical Stimulation   Electrical  Stimulation Location  left hip    Electrical Stimulation Action  IFC, 80-150 Hz x 15 minutes    Electrical Stimulation Goals  Pain      Manual Therapy   Manual Therapy  Soft tissue mobilization    Soft tissue mobilization  STW to left gluteal region and trigger point release to left pirformis.                  PT Long Term Goals - 08/29/17 1222      PT LONG TERM GOAL #1   Title  Pt will improve her FOTO from 67% limitation to </= 47% limitation.     Time  6    Period  Weeks    Status  New      PT LONG TERM GOAL #2   Title  Pt will be independent in her HEP and progression.     Time  6    Period  Weeks    Status  On-going  PT LONG TERM GOAL #3   Title  Pt will be able to amb with pain </= 3/10 for >/= 20 minutes.     Time  6    Period  Weeks    Status  New      PT LONG TERM GOAL #4   Title  Pt will be able to report > 50% improvement in sleep.     Time  6    Period  Weeks    Status  New      PT LONG TERM GOAL #5   Title  Pt will be able to improve her L LE strength to 5/5 in order to improve functional mobility.     Time  6    Period  Weeks    Status  New            Plan - 08/29/17 1220    Clinical Impression Statement  Pt arriving to therapy today reporting 5/10 pain in her left hip. Pt reporting her HEP is helping and she feels better after exercising. Pt reporting no pain at end of session following , STW, E-stim and moist heat. Continue skilled PT.     Rehab Potential  Excellent    PT Frequency  2x / week    PT Treatment/Interventions  ADLs/Self Care Home Management;Moist Heat;Electrical Stimulation;Cryotherapy;Ultrasound;Gait training;Stair training;Functional mobility training;Therapeutic activities;Therapeutic exercise;Manual techniques;Patient/family education;Passive range of motion;Dry needling;Taping    PT Next Visit Plan  Begin on Nustep, hip stretching and strengthening, E-stim and other modalities as needed.     PT Home Exercise Plan   IT band stretch, Piriformis stretch, bridges    Consulted and Agree with Plan of Care  Patient       Patient will benefit from skilled therapeutic intervention in order to improve the following deficits and impairments:  Abnormal gait, Pain, Decreased activity tolerance, Decreased strength, Decreased mobility, Difficulty walking, Decreased range of motion  Visit Diagnosis: Pain in left hip  Difficulty in walking, not elsewhere classified     Problem List There are no active problems to display for this patient.   Oretha Caprice, MPT 08/29/2017, 12:30 PM  [facility] [place], [place], [address] Phone: [phone]   Fax:  [phone]  Name: Patricia Mills MRN: [id number] Date of Birth: 1946-06-08

## 2017-08-31 ENCOUNTER — Encounter: Payer: PPO | Admitting: Physical Therapy

## 2017-09-05 ENCOUNTER — Encounter: Payer: Self-pay | Admitting: Physical Therapy

## 2017-09-05 ENCOUNTER — Ambulatory Visit: Payer: PPO | Admitting: Physical Therapy

## 2017-09-05 DIAGNOSIS — M25552 Pain in left hip: Secondary | ICD-10-CM | POA: Diagnosis not present

## 2017-09-05 DIAGNOSIS — R262 Difficulty in walking, not elsewhere classified: Secondary | ICD-10-CM

## 2017-09-05 NOTE — Patient Instructions (Signed)

## 2017-09-05 NOTE — Therapy (Signed)
[facility] [place], [place], [address] Phone: [phone]   Fax:  [phone]  Physical Therapy Treatment  Patient Details  Name: Patricia Mills MRN: [id number] Date of Birth: April 16, 1946 Referring Provider: Netta Cedars, MD   Encounter Date: 09/05/2017  PT End of Session - 09/05/17 1029    Visit Number  4    Number of Visits  12    Date for PT Re-Evaluation  10/24/17    Authorization Type  Kx modifier at visit 15, G-codes every 10th visit    PT Start Time  0945    PT Stop Time  1043    PT Time Calculation (min)  58 min    Activity Tolerance  Patient tolerated treatment well    Behavior During Therapy  [facility] for tasks assessed/performed       Past Medical History:  Diagnosis Date  . Diabetes mellitus without complication ([place])   . Hypertension   . Thyroid disease     Past Surgical History:  Procedure Laterality Date  . ABDOMINAL HYSTERECTOMY    . CHOLECYSTECTOMY    . TONSILLECTOMY      There were no vitals filed for this visit.  Subjective Assessment - 09/05/17 1000    Subjective  Patient reported increased soreness over weekend    Pertinent History  DM    Limitations  House hold activities;Walking    How long can you stand comfortably?  30 minutes    How long can you walk comfortably?  10 minutes    Currently in Pain?  Yes    Pain Score  4     Pain Location  Hip    Pain Orientation  Left    Pain Descriptors / Indicators  Aching;Discomfort    Pain Onset  More than a month ago    Pain Frequency  Intermittent    Aggravating Factors   prolong sitting, walking and lifting    Pain Relieving Factors  rest                      [facility] Adult PT Treatment/Exercise - 09/05/17 0001      Self-Care   Self-Care  ADL's;Lifting;Posture    ADL's  HEP provided      Exercises   Exercises  Lumbar;Knee/Hip      Lumbar Exercises: Supine   Ab Set  20 reps;3 seconds    Glut Set  20 reps;3 seconds    Bent Knee Raise  3 seconds 2x10    Bridge  3 seconds;20 reps    Straight Leg Raise  3 seconds 2x10      Knee/Hip Exercises: Stretches   ITB Stretch  3 reps;30 seconds    Piriformis Stretch  3 reps;30 seconds      Knee/Hip Exercises: Aerobic   Nustep  L3 x17min UE/LE      Moist Heat Therapy   Number Minutes Moist Heat  15 Minutes    Moist Heat Location  Hip;Lumbar Spine      Electrical Stimulation   Electrical Stimulation Location  left hip    Electrical Stimulation Action  IFC 80-150hz  x14min    Electrical Stimulation Goals  Pain             PT Education - 09/05/17 1031    Education provided  Yes    Education Details  HEP posture techniques    Person(s) Educated  Patient    Methods  Explanation;Demonstration;Handout    Comprehension  Verbalized understanding          PT Long Term Goals - 09/05/17 1030      PT LONG TERM GOAL #1   Title  Pt will improve her FOTO from 67% limitation to </= 47% limitation.     Time  6    Period  Weeks    Status  On-going      PT LONG TERM GOAL #2   Title  Pt will be independent in her HEP and progression.     Time  6    Period  Weeks    Status  On-going      PT LONG TERM GOAL #3   Title  Pt will be able to amb with pain </= 3/10 for >/= 20 minutes.     Time  6    Period  Weeks    Status  On-going      PT LONG TERM GOAL #4   Title  Pt will be able to report > 50% improvement in sleep.     Time  6    Period  Weeks    Status  On-going      PT LONG TERM GOAL #5   Title  Pt will be able to improve her L LE strength to 5/5 in order to improve functional mobility.     Time  6    Period  Weeks    Status  On-going            Plan - 09/05/17 1031    Clinical Impression Statement  Patient tolerated treatment well today. Educated patient on posture awareness techniques to avoid flare ups which patient reported with ADL's. Patient able to progress exercises today. Patient has found little relief thus far with back/hi  pain. Goals ongoing.     Rehab Potential  Excellent    PT Frequency  2x / week    PT Treatment/Interventions  ADLs/Self Care Home Management;Moist Heat;Electrical Stimulation;Cryotherapy;Ultrasound;Gait training;Stair training;Functional mobility training;Therapeutic activities;Therapeutic exercise;Manual techniques;Patient/family education;Passive range of motion;Dry needling;Taping    PT Next Visit Plan  Begin on Nustep, hip stretching and strengthening, E-stim and other modalities as needed. / patient will call MD for follow up with a possible shot in back due to no relief from shots in hip    Consulted and Agree with Plan of Care  Patient       Patient will benefit from skilled therapeutic intervention in order to improve the following deficits and impairments:  Abnormal gait, Pain, Decreased activity tolerance, Decreased strength, Decreased mobility, Difficulty walking, Decreased range of motion  Visit Diagnosis: Pain in left hip  Difficulty in walking, not elsewhere classified     Problem List There are no active problems to display for this patient.   Phillips Climes, PTA 09/05/2017, 10:47 AM  [facility] [place], [place], [address] Phone: [phone]   Fax:  [phone]  Name: Patricia Mills MRN: [id number] Date of Birth: 13-Oct-1945

## 2017-09-07 ENCOUNTER — Ambulatory Visit: Payer: PPO | Admitting: Physical Therapy

## 2017-09-07 ENCOUNTER — Encounter: Payer: Self-pay | Admitting: Physical Therapy

## 2017-09-07 DIAGNOSIS — R262 Difficulty in walking, not elsewhere classified: Secondary | ICD-10-CM

## 2017-09-07 DIAGNOSIS — M25552 Pain in left hip: Secondary | ICD-10-CM | POA: Diagnosis not present

## 2017-09-07 NOTE — Therapy (Addendum)
[facility] [place], [place], [address] Phone: [phone]   Fax:  [phone]  Physical Therapy Treatment Discharge  Patient Details  Name: Patricia Mills MRN: [id number] Date of Birth: May 06, 1946 Referring Provider: Netta Cedars, MD   Encounter Date: 09/07/2017  PT End of Session - 09/07/17 0952    Visit Number  5    Number of Visits  12    Date for PT Re-Evaluation  10/24/17    Authorization Type  Kx modifier at visit 15, G-codes every 10th visit    PT Start Time  0948    PT Stop Time  1045    PT Time Calculation (min)  57 min    Activity Tolerance  Patient tolerated treatment well    Behavior During Therapy  [facility] for tasks assessed/performed       Past Medical History:  Diagnosis Date  . Diabetes mellitus without complication ([place])   . Hypertension   . Thyroid disease     Past Surgical History:  Procedure Laterality Date  . ABDOMINAL HYSTERECTOMY    . CHOLECYSTECTOMY    . TONSILLECTOMY      There were no vitals filed for this visit.  Subjective Assessment - 09/07/17 0950    Subjective  Reports that the pain in her L hip kept her from sleeping last night and could not find a comfortable position.    Pertinent History  DM    Limitations  House hold activities;Walking    How long can you stand comfortably?  30 minutes    How long can you walk comfortably?  10 minutes    Currently in Pain?  Yes    Pain Score  8     Pain Location  Hip    Pain Orientation  Left    Pain Descriptors / Indicators  Stabbing    Pain Type  Chronic pain    Pain Onset  More than a month ago    Aggravating Factors   Walking, lifting    Pain Relieving Factors  Rest         [facility] PT Assessment - 09/07/17 0001      Assessment   Medical Diagnosis  L hip pain    Next MD Visit  09/08/2017      Precautions   Precautions  None      Restrictions   Weight Bearing Restrictions  No                  [facility] Adult PT  Treatment/Exercise - 09/07/17 0001      Lumbar Exercises: Aerobic   Stationary Bike  L4 x19 min      Lumbar Exercises: Standing   Other Standing Lumbar Exercises  Standing shoulder pressdown x20 reps with 5 sec      Lumbar Exercises: Supine   Clam  20 reps    Bent Knee Raise  Other (comment) x30 reps    Bridge  3 seconds;20 reps No pain    Straight Leg Raise  20 reps      Modalities   Modalities  Electrical Stimulation;Moist Heat      Moist Heat Therapy   Number Minutes Moist Heat  15 Minutes    Moist Heat Location  Lumbar Spine      Electrical Stimulation   Electrical Stimulation Location  B low back    Electrical Stimulation Action  Pre-Mod    Electrical Stimulation Parameters  80-150 hz x15 min    Electrical  Stimulation Goals  Pain                  PT Long Term Goals - 09/05/17 1030      PT LONG TERM GOAL #1   Title  Pt will improve her FOTO from 67% limitation to </= 47% limitation.     Time  6    Period  Weeks    Status  On-going      PT LONG TERM GOAL #2   Title  Pt will be independent in her HEP and progression.     Time  6    Period  Weeks    Status  On-going      PT LONG TERM GOAL #3   Title  Pt will be able to amb with pain </= 3/10 for >/= 20 minutes.     Time  6    Period  Weeks    Status  On-going      PT LONG TERM GOAL #4   Title  Pt will be able to report > 50% improvement in sleep.     Time  6    Period  Weeks    Status  On-going      PT LONG TERM GOAL #5   Title  Pt will be able to improve her L LE strength to 5/5 in order to improve functional mobility.     Time  6    Period  Weeks    Status  On-going            Plan - 09/07/17 1043    Clinical Impression Statement  Patient guided through core strengthening exercises today with no complaints of any increased pain. Patient very excited regarding no pain with Nustep today. Proper core activation instruction provided throughout treatment with exercises. No relief reported  by patient with hip injections and pain is still very intense with walking, weightbearing and lifting activities. Patient reported relief with standing shoulder pressdown for core as she felt as if she was lifted. Normal modalities response noted following removal of the modalities.    Rehab Potential  Excellent    PT Frequency  2x / week    PT Treatment/Interventions  ADLs/Self Care Home Management;Moist Heat;Electrical Stimulation;Cryotherapy;Ultrasound;Gait training;Stair training;Functional mobility training;Therapeutic activities;Therapeutic exercise;Manual techniques;Patient/family education;Passive range of motion;Dry needling;Taping    PT Next Visit Plan  Reassess based on MD visit and symptoms per MPT POC.    PT Home Exercise Plan  IT band stretch, Piriformis stretch, bridges    Consulted and Agree with Plan of Care  Patient       Patient will benefit from skilled therapeutic intervention in order to improve the following deficits and impairments:  Abnormal gait, Pain, Decreased activity tolerance, Decreased strength, Decreased mobility, Difficulty walking, Decreased range of motion  Visit Diagnosis: Pain in left hip  Difficulty in walking, not elsewhere classified    PHYSICAL THERAPY DISCHARGE SUMMARY  Visits from Start of Care: 5   Current functional level related to goals / functional outcomes: See above   Remaining deficits: See above   Education / Equipment: HEP Plan: Patient agrees to discharge.  Patient goals were not met. Patient is being discharged due to not returning since the last visit.  ?????     Kearney Hard, PT 04/07/20 12:00 PM    Problem List There are no active problems to display for this patient.   Standley Brooking, PTA 09/07/17 12:17 PM Mali Applegate MPT  [facility] [place], [place], [address] Phone: [phone]   Fax:  [phone]  Name: Patricia Mills MRN:  [id number] Date of Birth: 25-May-1946

## 2017-09-08 DIAGNOSIS — M5136 Other intervertebral disc degeneration, lumbar region: Secondary | ICD-10-CM | POA: Diagnosis not present

## 2017-09-12 ENCOUNTER — Encounter: Payer: PPO | Admitting: Physical Therapy

## 2017-09-14 ENCOUNTER — Encounter: Payer: PPO | Admitting: Physical Therapy

## 2017-09-28 DIAGNOSIS — M7989 Other specified soft tissue disorders: Secondary | ICD-10-CM | POA: Diagnosis not present

## 2017-09-28 DIAGNOSIS — E039 Hypothyroidism, unspecified: Secondary | ICD-10-CM | POA: Diagnosis not present

## 2017-09-28 DIAGNOSIS — R600 Localized edema: Secondary | ICD-10-CM | POA: Diagnosis not present

## 2017-10-07 DIAGNOSIS — E119 Type 2 diabetes mellitus without complications: Secondary | ICD-10-CM | POA: Diagnosis not present

## 2017-10-10 DIAGNOSIS — E119 Type 2 diabetes mellitus without complications: Secondary | ICD-10-CM | POA: Diagnosis not present

## 2017-10-10 DIAGNOSIS — Z794 Long term (current) use of insulin: Secondary | ICD-10-CM | POA: Diagnosis not present

## 2017-10-18 DIAGNOSIS — M5136 Other intervertebral disc degeneration, lumbar region: Secondary | ICD-10-CM | POA: Diagnosis not present

## 2017-11-01 DIAGNOSIS — M545 Low back pain: Secondary | ICD-10-CM | POA: Diagnosis not present

## 2018-01-02 DIAGNOSIS — E785 Hyperlipidemia, unspecified: Secondary | ICD-10-CM | POA: Diagnosis not present

## 2018-01-02 DIAGNOSIS — E119 Type 2 diabetes mellitus without complications: Secondary | ICD-10-CM | POA: Diagnosis not present

## 2018-01-02 DIAGNOSIS — I10 Essential (primary) hypertension: Secondary | ICD-10-CM | POA: Diagnosis not present

## 2018-01-02 DIAGNOSIS — E039 Hypothyroidism, unspecified: Secondary | ICD-10-CM | POA: Diagnosis not present

## 2018-01-03 DIAGNOSIS — F419 Anxiety disorder, unspecified: Secondary | ICD-10-CM | POA: Diagnosis not present

## 2018-01-03 DIAGNOSIS — R600 Localized edema: Secondary | ICD-10-CM | POA: Diagnosis not present

## 2018-01-03 DIAGNOSIS — I10 Essential (primary) hypertension: Secondary | ICD-10-CM | POA: Diagnosis not present

## 2018-01-03 DIAGNOSIS — M7989 Other specified soft tissue disorders: Secondary | ICD-10-CM | POA: Diagnosis not present

## 2018-01-31 DIAGNOSIS — M546 Pain in thoracic spine: Secondary | ICD-10-CM | POA: Diagnosis not present

## 2018-01-31 DIAGNOSIS — G8929 Other chronic pain: Secondary | ICD-10-CM | POA: Diagnosis not present

## 2018-02-09 DIAGNOSIS — R197 Diarrhea, unspecified: Secondary | ICD-10-CM | POA: Diagnosis not present

## 2018-02-09 DIAGNOSIS — K921 Melena: Secondary | ICD-10-CM | POA: Diagnosis not present

## 2018-02-14 DIAGNOSIS — R197 Diarrhea, unspecified: Secondary | ICD-10-CM | POA: Diagnosis not present

## 2018-02-14 DIAGNOSIS — R194 Change in bowel habit: Secondary | ICD-10-CM | POA: Diagnosis not present

## 2018-02-14 DIAGNOSIS — K219 Gastro-esophageal reflux disease without esophagitis: Secondary | ICD-10-CM | POA: Diagnosis not present

## 2018-02-14 DIAGNOSIS — Z800 Family history of malignant neoplasm of digestive organs: Secondary | ICD-10-CM | POA: Diagnosis not present

## 2018-02-16 DIAGNOSIS — K219 Gastro-esophageal reflux disease without esophagitis: Secondary | ICD-10-CM | POA: Diagnosis not present

## 2018-02-16 DIAGNOSIS — Z800 Family history of malignant neoplasm of digestive organs: Secondary | ICD-10-CM | POA: Diagnosis not present

## 2018-02-16 DIAGNOSIS — R197 Diarrhea, unspecified: Secondary | ICD-10-CM | POA: Diagnosis not present

## 2018-02-16 DIAGNOSIS — R194 Change in bowel habit: Secondary | ICD-10-CM | POA: Diagnosis not present

## 2018-07-17 DIAGNOSIS — Z1231 Encounter for screening mammogram for malignant neoplasm of breast: Secondary | ICD-10-CM | POA: Diagnosis not present

## 2018-07-24 DIAGNOSIS — M542 Cervicalgia: Secondary | ICD-10-CM | POA: Diagnosis not present

## 2018-07-24 DIAGNOSIS — M25561 Pain in right knee: Secondary | ICD-10-CM | POA: Diagnosis not present

## 2018-07-24 DIAGNOSIS — M5412 Radiculopathy, cervical region: Secondary | ICD-10-CM | POA: Diagnosis not present

## 2018-08-01 DIAGNOSIS — E119 Type 2 diabetes mellitus without complications: Secondary | ICD-10-CM | POA: Diagnosis not present

## 2018-08-01 DIAGNOSIS — E785 Hyperlipidemia, unspecified: Secondary | ICD-10-CM | POA: Diagnosis not present

## 2018-08-01 DIAGNOSIS — F3341 Major depressive disorder, recurrent, in partial remission: Secondary | ICD-10-CM | POA: Diagnosis not present

## 2018-08-01 DIAGNOSIS — R55 Syncope and collapse: Secondary | ICD-10-CM | POA: Diagnosis not present

## 2018-08-01 DIAGNOSIS — Z23 Encounter for immunization: Secondary | ICD-10-CM | POA: Diagnosis not present

## 2018-08-01 DIAGNOSIS — F411 Generalized anxiety disorder: Secondary | ICD-10-CM | POA: Diagnosis not present

## 2018-08-01 DIAGNOSIS — I10 Essential (primary) hypertension: Secondary | ICD-10-CM | POA: Diagnosis not present

## 2018-08-01 DIAGNOSIS — E039 Hypothyroidism, unspecified: Secondary | ICD-10-CM | POA: Diagnosis not present

## 2018-08-03 DIAGNOSIS — E119 Type 2 diabetes mellitus without complications: Secondary | ICD-10-CM | POA: Diagnosis not present

## 2018-08-03 DIAGNOSIS — E785 Hyperlipidemia, unspecified: Secondary | ICD-10-CM | POA: Diagnosis not present

## 2018-08-03 DIAGNOSIS — E039 Hypothyroidism, unspecified: Secondary | ICD-10-CM | POA: Diagnosis not present

## 2018-08-03 DIAGNOSIS — I10 Essential (primary) hypertension: Secondary | ICD-10-CM | POA: Diagnosis not present

## 2018-08-08 ENCOUNTER — Telehealth: Payer: Self-pay

## 2018-08-08 NOTE — Telephone Encounter (Signed)
SENT REFERRAL TO SCHEDULING AND FILED NOTES 

## 2018-08-09 ENCOUNTER — Other Ambulatory Visit: Payer: Self-pay | Admitting: Physician Assistant

## 2018-08-09 DIAGNOSIS — R55 Syncope and collapse: Secondary | ICD-10-CM

## 2018-08-15 ENCOUNTER — Ambulatory Visit
Admission: RE | Admit: 2018-08-15 | Discharge: 2018-08-15 | Disposition: A | Discharge status: Home / Self Care | Payer: PPO | Source: Ambulatory Visit | Attending: Physician Assistant | Admitting: Physician Assistant

## 2018-08-15 ENCOUNTER — Ambulatory Visit: Payer: PPO | Admitting: Family Medicine

## 2018-08-15 DIAGNOSIS — R55 Syncope and collapse: Secondary | ICD-10-CM

## 2018-08-15 DIAGNOSIS — R51 Headache: Secondary | ICD-10-CM | POA: Diagnosis not present

## 2018-08-16 ENCOUNTER — Ambulatory Visit: Payer: PPO | Admitting: Family Medicine

## 2018-08-31 ENCOUNTER — Ambulatory Visit: Payer: PPO | Admitting: Cardiology

## 2018-09-11 NOTE — Progress Notes (Deleted)
Cardiology Office Note   Date:  09/11/2018   ID:  Patricia Mills, DOB 1945/10/31, MRN [id number]  PCP:  Patient, No Pcp Per  Cardiologist:   No primary care provider on file. Referring:  ***  No chief complaint on file.     History of Present Illness: Patricia Mills is a 72 y.o. female who was referred by *** for evaluation of ***.    I do note in her primary care note that she was not orthostatic at the time of her primary clinic appt.  ***   Patient complains of a syncopal episode 1.5 weeks ago. She states that she was in the kitchen making coffee and she suddenly felt odd and was starting to get nauseated and a little dizzy. She sat down at the kitchen table to check her blood sugar, and the next thing she remembers is waking up on the kitchen floor. She states that she was disoriented and it took her a while to get up because she felt very weak. She thinks that she did hit her head when she fell. She states that she was very sweaty when she woke up and had urinated on herself, but she denies a tongue injury. When she came to, she checked her blood sugar and it was 122. She reports that her blood pressure was 84/52. She states that she drinks plenty of water. She lives alone. She states that she has not experienced another episode like this and her BP readings have been fine since then. Denies chest pain, palpitations, SOB, vomiting, change in vision, headache. Denies hx of seizures or syncope.    Past Medical History:  Diagnosis Date  . Diabetes mellitus without complication ([place])   . Hypertension   . Thyroid disease     Past Surgical History:  Procedure Laterality Date  . ABDOMINAL HYSTERECTOMY    . CHOLECYSTECTOMY    . TONSILLECTOMY       Current Outpatient Medications  Medication Sig Dispense Refill  . aspirin 81 MG chewable tablet Chew by mouth daily.    Marland Kitchen esomeprazole (NEXIUM) 20 MG packet Take 20 mg by mouth daily before breakfast.    . insulin NPH-regular Human  (NOVOLIN 70/30) (70-30) 100 UNIT/ML injection Inject into the skin.    Marland Kitchen levothyroxine (SYNTHROID, LEVOTHROID) 25 MCG tablet Take 25 mcg by mouth daily before breakfast.    . LORazepam (ATIVAN) 0.5 MG tablet Take 0.5 mg by mouth every 8 (eight) hours.    Marland Kitchen losartan (COZAAR) 50 MG tablet Take 50 mg by mouth daily.    . sertraline (ZOLOFT) 50 MG tablet Take 50 mg by mouth daily.    . sitaGLIPtin-metformin (JANUMET) 50-500 MG tablet Take 1 tablet by mouth 2 (two) times daily with a meal.     No current facility-administered medications for this visit.     Allergies:   Patient has no known allergies.    Social History:  The patient  reports that she has been smoking cigarettes. She has a 10.00 pack-year smoking history. She has never used smokeless tobacco. She reports that she does not drink alcohol or use drugs.   Family History:  The patient's ***family history is not on file.    ROS:  Please see the history of present illness.   Otherwise, review of systems are positive for {NONE DEFAULTED:18576::"none"}.   All other systems are reviewed and negative.    PHYSICAL EXAM: VS:  There were no vitals taken for this visit. ,  BMI There is no height or weight on file to calculate BMI. GENERAL:  Well appearing HEENT:  Pupils equal round and reactive, fundi not visualized, oral mucosa unremarkable NECK:  No jugular venous distention, waveform within normal limits, carotid upstroke brisk and symmetric, no bruits, no thyromegaly LYMPHATICS:  No cervical, inguinal adenopathy LUNGS:  Clear to auscultation bilaterally BACK:  No CVA tenderness CHEST:  Unremarkable HEART:  PMI not displaced or sustained,S1 and S2 within normal limits, no S3, no S4, no clicks, no rubs, *** murmurs ABD:  Flat, positive bowel sounds normal in frequency in pitch, no bruits, no rebound, no guarding, no midline pulsatile mass, no hepatomegaly, no splenomegaly EXT:  2 plus pulses throughout, no edema, no cyanosis no  clubbing SKIN:  No rashes no nodules NEURO:  Cranial nerves II through XII grossly intact, motor grossly intact throughout PSYCH:  Cognitively intact, oriented to person place and time    EKG:  EKG {ACTION; IS/IS UXL:24401027} ordered today. The ekg ordered today demonstrates ***   Recent Labs: No results found for requested labs within last 8760 hours.    Lipid Panel No results found for: CHOL, TRIG, HDL, CHOLHDL, VLDL, LDLCALC, LDLDIRECT    Wt Readings from Last 3 Encounters:  01/03/15 225 lb (102.1 kg)      Other studies Reviewed: Additional studies/ records that were reviewed today include: ***. Review of the above records demonstrates:  Please see elsewhere in the note.  ***   ASSESSMENT AND PLAN:  SYNCOPE: ***  DYSLIPIDEMIA:  ***  DM:  ***  HTN:  ***    Current medicines are reviewed at length with the patient today.  The patient {ACTIONS; HAS/DOES NOT HAVE:19233} concerns regarding medicines.  The following changes have been made:  {PLAN; NO CHANGE:13088:s}  Labs/ tests ordered today include: *** No orders of the defined types were placed in this encounter.    Disposition:   FU with ***    Signed, Minus Breeding, MD  09/11/2018 8:57 PM    [facility]

## 2018-09-13 ENCOUNTER — Ambulatory Visit: Payer: PPO | Admitting: Cardiology

## 2018-09-19 DIAGNOSIS — E119 Type 2 diabetes mellitus without complications: Secondary | ICD-10-CM | POA: Diagnosis not present

## 2018-09-19 DIAGNOSIS — T383X5A Adverse effect of insulin and oral hypoglycemic [antidiabetic] drugs, initial encounter: Secondary | ICD-10-CM | POA: Diagnosis not present

## 2018-10-06 ENCOUNTER — Ambulatory Visit: Payer: PPO | Admitting: Cardiovascular Disease

## 2018-10-06 ENCOUNTER — Encounter: Payer: Self-pay | Admitting: Cardiovascular Disease

## 2018-10-06 VITALS — BP 142/70 | HR 65 | Ht 68.0 in | Wt 189.0 lb

## 2018-10-06 DIAGNOSIS — I10 Essential (primary) hypertension: Secondary | ICD-10-CM | POA: Diagnosis not present

## 2018-10-06 DIAGNOSIS — R55 Syncope and collapse: Secondary | ICD-10-CM | POA: Insufficient documentation

## 2018-10-06 DIAGNOSIS — E118 Type 2 diabetes mellitus with unspecified complications: Secondary | ICD-10-CM | POA: Diagnosis not present

## 2018-10-06 DIAGNOSIS — E782 Mixed hyperlipidemia: Secondary | ICD-10-CM | POA: Diagnosis not present

## 2018-10-06 DIAGNOSIS — E785 Hyperlipidemia, unspecified: Secondary | ICD-10-CM | POA: Insufficient documentation

## 2018-10-06 NOTE — Patient Instructions (Signed)
Medication Instructions:  Your physician recommends that you continue on your current medications as directed. Please refer to the Current Medication list given to you today.  If you need a refill on your cardiac medications before your next appointment, please call your pharmacy.   Lab work: NONE If you have labs (blood work) drawn today and your tests are completely normal, you will receive your results only by: . MyChart Message (if you have MyChart) OR . A paper copy in the mail If you have any lab test that is abnormal or we need to change your treatment, we will call you to review the results.  Testing/Procedures: NONE  Follow-Up: At [facility], you and your health needs are our priority.  As part of our continuing mission to provide you with exceptional heart care, we have created designated Provider Care Teams.  These Care Teams include your primary Cardiologist (physician) and Advanced Practice Providers (APPs -  Physician Assistants and Nurse Practitioners) who all work together to provide you with the care you need, when you need it. You will need a follow up appointment in 12 months.  Please call our office 2 months in advance to schedule this appointment.  You may see DR. BERRY or one of the following Advanced Practice Providers on your designated Care Team:   Luke Kilroy, PA-C Krista Kroeger, PA-C . Callie Goodrich, PA-C    

## 2018-10-06 NOTE — Assessment & Plan Note (Signed)
History of essential hypertension with blood pressure measured today at 142/70.  She is on losartan.  Continue current meds at current dosing.

## 2018-10-06 NOTE — Progress Notes (Signed)
10/06/2018 Patricia Mills   July 25, 1946  [id number]  Primary Physician Heywood Bene, PA-C Primary Cardiologist: Lorretta Harp MD Lupe Carney, [place]  HPI:  Patricia Mills is a 72 y.o. 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  She was referred by her primary care provider, Clyde Lundborg, PA-C, for evaluation of a recent episode of syncope.  Her risk factors include continued tobacco abuse approximately 15 pack years currently smoking 4 cigarettes a day.  She does have a history of treated hypertension, untreated hyperlipidemia and treated diabetes.  There is no family history of heart disease.  She is never had a heart attack or stroke.  She denies chest pain or shortness of breath.  She does complain of some changes compatible with diabetic peripheral neuropathy.  She an episode of syncope 6 to 8 weeks ago in the morning while making herself breakfast sitting down.  She felt herself on the floor.  There is no loss of bowel or bladder continence.  She felt weak for several hours after that.  She did check her blood sugar at that time which was essentially normal although her blood pressure was moderately reduced.  She is had no recurrent symptoms.   No outpatient medications have been marked as taking for the 10/06/18 encounter (Office Visit) with Lorretta Harp, MD.     Allergies  Allergen Reactions  . Penicillins Itching and Other (See Comments)    Burning sensation.    Social History   Socioeconomic History  . Marital status: Divorced    Spouse name: Not on file  . Number of children: Not on file  . Years of education: Not on file  . Highest education level: Not on file  Occupational History  . Not on file  Social Needs  . Financial resource strain: Not on file  . Food insecurity:    Worry: Not on file    Inability: Not on file  .  Transportation needs:    Medical: Not on file    Non-medical: Not on file  Tobacco Use  . Smoking status: Current Every Day Smoker    Packs/day: 0.50    Years: 20.00    Pack years: 10.00    Types: Cigarettes  . Smokeless tobacco: Never Used  Substance and Sexual Activity  . Alcohol use: No  . Drug use: No  . Sexual activity: Not on file  Lifestyle  . Physical activity:    Days per week: Not on file    Minutes per session: Not on file  . Stress: Not on file  Relationships  . Social connections:    Talks on phone: Not on file    Gets together: Not on file    Attends religious service: Not on file    Active member of club or organization: Not on file    Attends meetings of clubs or organizations: Not on file    Relationship status: Not on file  . Intimate partner violence:    Fear of current or ex partner: Not on file    Emotionally abused: Not on file    Physically abused: Not on file    Forced sexual activity: Not on file  Other Topics Concern  . Not on file  Social History Narrative  . Not on file     Review of Systems: General: negative for chills, fever, night  sweats or weight changes.  Cardiovascular: negative for chest pain, dyspnea on exertion, edema, orthopnea, palpitations, paroxysmal nocturnal dyspnea or shortness of breath Dermatological: negative for rash Respiratory: negative for cough or wheezing Urologic: negative for hematuria Abdominal: negative for nausea, vomiting, diarrhea, bright red blood per rectum, melena, or hematemesis Neurologic: negative for visual changes, syncope, or dizziness All other systems reviewed and are otherwise negative except as noted above.    Blood pressure (!) 142/70, pulse 65, height 5\' 8"  (1.727 m), weight 189 lb (85.7 kg).  General appearance: alert and no distress Neck: no adenopathy, no carotid bruit, no JVD, supple, symmetrical, trachea midline and thyroid not enlarged, symmetric, no tenderness/mass/nodules Lungs:  clear to auscultation bilaterally Heart: regular rate and rhythm, S1, S2 normal, no murmur, click, rub or gallop Extremities: extremities normal, atraumatic, no cyanosis or edema Pulses: 2+ and symmetric Skin: Skin color, texture, turgor normal. No rashes or lesions Neurologic: Alert and oriented X 3, normal strength and tone. Normal symmetric reflexes. Normal coordination and gait  EKG sinus rhythm at 65 without ST or T wave changes there were inferior Q waves and septal Q waves as well.  I personally reviewed this EKG.  ASSESSMENT AND PLAN:   Syncope Ms. Patricia Mills was referred to me by Clyde Lundborg, PA-C for evaluation of syncope.  This occurred 6 to 8 weeks ago in the morning while having breakfast.  She found herself on the floor.  There were no warning signs.  She did check her serum glucose which was normal although she was somewhat hypotensive.  She was fatigued for several hours after that.  She said no recurrent symptoms.  At this point, it is difficult to determine the etiology.  Since this was an isolated event I do not feel compelled to put a monitor on her.  I have counseled her about driving which she cannot do for 6 months.  She has recurrent eventual need an event monitor and/or loop recorder.  Essential hypertension History of essential hypertension with blood pressure measured today at 142/70.  She is on losartan.  Continue current meds at current dosing.  Hyperlipidemia She of hyperlipidemia currently not on statin therapy.  Her primary provider has tried to begin her on lipid-lowering therapy but she has been resistant to do this.      Lorretta Harp MD FACP,FACC,FAHA, [facility] 10/06/2018 10:09 AM

## 2018-10-06 NOTE — Assessment & Plan Note (Signed)
She of hyperlipidemia currently not on statin therapy.  Her primary provider has tried to begin her on lipid-lowering therapy but she has been resistant to do this.

## 2018-10-06 NOTE — Assessment & Plan Note (Signed)
Patricia Mills was referred to me by Clyde Lundborg, PA-C for evaluation of syncope.  This occurred 6 to 8 weeks ago in the morning while having breakfast.  She found herself on the floor.  There were no warning signs.  She did check her serum glucose which was normal although she was somewhat hypotensive.  She was fatigued for several hours after that.  She said no recurrent symptoms.  At this point, it is difficult to determine the etiology.  Since this was an isolated event I do not feel compelled to put a monitor on her.  I have counseled her about driving which she cannot do for 6 months.  She has recurrent eventual need an event monitor and/or loop recorder.

## 2018-10-12 ENCOUNTER — Other Ambulatory Visit: Payer: Self-pay

## 2018-10-12 ENCOUNTER — Emergency Department (INDEPENDENT_AMBULATORY_CARE_PROVIDER_SITE_OTHER)
Admission: EM | Admit: 2018-10-12 | Discharge: 2018-10-12 | Disposition: A | Discharge status: Home / Self Care | Payer: PPO | Source: Home / Self Care | Attending: Family Medicine | Admitting: Family Medicine

## 2018-10-12 DIAGNOSIS — J209 Acute bronchitis, unspecified: Secondary | ICD-10-CM | POA: Diagnosis not present

## 2018-10-12 MED ORDER — AZITHROMYCIN 250 MG PO TABS: 250.0000 mg | ORAL_TABLET | Freq: Every day | ORAL | 0 refills | Status: DC

## 2018-10-12 MED ORDER — METHYLPREDNISOLONE SODIUM SUCC 40 MG IJ SOLR
40.0000 mg | Freq: Once | INTRAMUSCULAR | Status: AC
Start: 2018-10-12 — End: 2018-10-12
  Administered 2018-10-12: 40 mg via INTRAMUSCULAR

## 2018-10-12 MED ORDER — PREDNISONE 20 MG PO TABS: ORAL_TABLET | ORAL | 0 refills | Status: DC

## 2018-10-12 NOTE — ED Triage Notes (Signed)
Cough started Monday, felt like it moved to her chest on Tuesday.  Today, pressure in her head, headache, non productive cough, and denies fever.

## 2018-10-12 NOTE — Discharge Instructions (Signed)
°  Please take antibiotics as prescribed and be sure to complete entire course even if you start to feel better to ensure infection does not come back.  You were given an injection of solumedrol (a steroid) to help with inflammation in your lungs. This medication will start in your system for about 2 days.  If you feel much improved, you do not need to take the oral prednisone but if you feel like you still have tightness in your chest or trouble breathing you may take the prescribed oral prednisone.  Please follow up with family medicine in 1 week if needed.

## 2018-10-12 NOTE — ED Provider Notes (Signed)
[facility]    CSN: [id number] Arrival date & time: 10/12/18  1244     History   Chief Complaint Chief Complaint  Patient presents with  . Cough    HPI Patricia Mills is a 73 y.o. female.   HPI  Patricia Mills is a 73 y.o. female presenting to UC with c/o cough that started 1 week ago, moved into her chest day after symptoms started.  She is c/o HA and non-productive cough today. Denies fever, chills, n/v/d. OTC cough medication has not helped.    Past Medical History:  Diagnosis Date  . Diabetes mellitus without complication ([place])   . Hypertension   . Thyroid disease     Patient Active Problem List   Diagnosis Date Noted  . Syncope 10/06/2018  . Essential hypertension 10/06/2018  . Hyperlipidemia 10/06/2018  . Type 2 diabetes mellitus with complication, without long-term current use of insulin ([place]) 10/06/2018    Past Surgical History:  Procedure Laterality Date  . ABDOMINAL HYSTERECTOMY    . CHOLECYSTECTOMY    . TONSILLECTOMY      OB History   No obstetric history on file.      Home Medications    Prior to Admission medications   Medication Sig Start Date End Date Taking? Authorizing Provider  Insulin Glargine (LANTUS SOLOSTAR) 100 UNIT/ML Solostar Pen INJECT 5 UNITS SUBCUTANEOUSLY AT NIGHT 01/03/18  Yes [provider]  sitaGLIPtin (JANUVIA) 100 MG tablet Take by mouth. 09/19/18  Yes [provider]  aspirin 81 MG chewable tablet Chew by mouth daily.    [provider]  azithromycin (ZITHROMAX) 250 MG tablet Take 1 tablet (250 mg total) by mouth daily. Take first 2 tablets together, then 1 every day until finished. 10/12/18   Noe Gens, PA-C  esomeprazole (NEXIUM) 20 MG packet Take 20 mg by mouth daily before breakfast.    [provider]  insulin NPH-regular Human (NOVOLIN 70/30) (70-30) 100 UNIT/ML injection Inject into the skin.    [provider]  levothyroxine (SYNTHROID, LEVOTHROID) 25 MCG  tablet Take 25 mcg by mouth daily before breakfast.    [provider]  LORazepam (ATIVAN) 0.5 MG tablet Take 0.5 mg by mouth every 8 (eight) hours.    [provider]  losartan (COZAAR) 50 MG tablet Take 50 mg by mouth daily.    [provider]  predniSONE (DELTASONE) 20 MG tablet 3 tabs po day one, then 2 po daily x 4 days 10/12/18   Noe Gens, PA-C  sertraline (ZOLOFT) 50 MG tablet Take 50 mg by mouth daily.    [provider]  sitaGLIPtin-metformin (JANUMET) 50-500 MG tablet Take 1 tablet by mouth 2 (two) times daily with a meal.    [provider]    Family History Family History  Problem Relation Age of Onset  . Stroke Mother     Social History Social History   Tobacco Use  . Smoking status: Current Every Day Smoker    Packs/day: 0.50    Years: 20.00    Pack years: 10.00    Types: Cigarettes  . Smokeless tobacco: Never Used  Substance Use Topics  . Alcohol use: No  . Drug use: No     Allergies   Penicillins   Review of Systems Review of Systems  Constitutional: Negative for chills and fever.  HENT: Positive for congestion and postnasal drip. Negative for sore throat.   Respiratory: Positive for cough. Negative for chest  tightness and shortness of breath.   Gastrointestinal: Negative for diarrhea, nausea and vomiting.  Neurological: Negative for dizziness, light-headedness and headaches.     Physical Exam Triage Vital Signs ED Triage Vitals  Enc Vitals Group     BP 10/12/18 1307 (!) 142/83     Pulse Rate 10/12/18 1307 66     Resp 10/12/18 1307 18     Temp 10/12/18 1307 98 F (36.7 C)     Temp Source 10/12/18 1307 Oral     SpO2 10/12/18 1307 98 %     Weight 10/12/18 1310 189 lb (85.7 kg)     Height 10/12/18 1310 5\' 8"  (1.727 m)     Head Circumference --      Peak Flow --      Pain Score 10/12/18 1309 0     Pain Loc --      Pain Edu? --      Excl. in [place]? --    No data found.  Updated Vital Signs BP  (!) 142/83 (BP Location: Right Arm)   Pulse 66   Temp 98 F (36.7 C) (Oral)   Resp 18   Ht 5\' 8"  (1.727 m)   Wt 189 lb (85.7 kg)   SpO2 98%   BMI 28.74 kg/m   Visual Acuity Right Eye Distance:   Left Eye Distance:   Bilateral Distance:    Right Eye Near:   Left Eye Near:    Bilateral Near:     Physical Exam Vitals signs and nursing note reviewed.  Constitutional:      Appearance: Normal appearance. She is well-developed.  HENT:     Head: Normocephalic and atraumatic.     Right Ear: Tympanic membrane normal.     Left Ear: Tympanic membrane normal.     Nose: Nose normal.     Right Sinus: No maxillary sinus tenderness or frontal sinus tenderness.     Left Sinus: No maxillary sinus tenderness or frontal sinus tenderness.     Mouth/Throat:     Lips: Pink.     Mouth: Mucous membranes are moist.     Pharynx: Oropharynx is clear. Uvula midline.  Neck:     Musculoskeletal: Normal range of motion.  Cardiovascular:     Rate and Rhythm: Normal rate and regular rhythm.  Pulmonary:     Effort: Pulmonary effort is normal. No respiratory distress.     Breath sounds: No stridor. Wheezing ( faint, diffuse) and rhonchi (faint, diffuse) present.  Musculoskeletal: Normal range of motion.  Skin:    General: Skin is warm and dry.  Neurological:     Mental Status: She is alert and oriented to person, place, and time.  Psychiatric:        Behavior: Behavior normal.      UC Treatments / Results  Labs (all labs ordered are listed, but only abnormal results are displayed) Labs Reviewed - No data to display  EKG None  Radiology No results found.  Procedures Procedures (including critical care time)  Medications Ordered in UC Medications  methylPREDNISolone sodium succinate (SOLU-MEDROL) 40 mg/mL injection 40 mg (40 mg Intramuscular Given 10/12/18 1325)    Initial Impression / Assessment and Plan / UC Course  I have reviewed the triage vital signs and the nursing  notes.  Pertinent labs & imaging results that were available during my care of the patient were reviewed by me and considered in my medical decision making (see chart for details).     Will  cover for atypical bacteria with azithromycin and symptomatically with steroids  Final Clinical Impressions(s) / UC Diagnoses   Final diagnoses:  Acute bronchitis, unspecified organism     Discharge Instructions      Please take antibiotics as prescribed and be sure to complete entire course even if you start to feel better to ensure infection does not come back.  You were given an injection of solumedrol (a steroid) to help with inflammation in your lungs. This medication will start in your system for about 2 days.  If you feel much improved, you do not need to take the oral prednisone but if you feel like you still have tightness in your chest or trouble breathing you may take the prescribed oral prednisone.  Please follow up with family medicine in 1 week if needed.     ED Prescriptions    Medication Sig Dispense Auth. Provider   azithromycin (ZITHROMAX) 250 MG tablet Take 1 tablet (250 mg total) by mouth daily. Take first 2 tablets together, then 1 every day until finished. 6 tablet Gerarda Fraction, Eloise Mula O, PA-C   predniSONE (DELTASONE) 20 MG tablet 3 tabs po day one, then 2 po daily x 4 days 11 tablet Noe Gens, PA-C     Controlled Substance Prescriptions  Controlled Substance Registry consulted? Not Applicable   Tyrell Antonio 10/16/18 0840

## 2018-10-17 DIAGNOSIS — J209 Acute bronchitis, unspecified: Secondary | ICD-10-CM | POA: Diagnosis not present

## 2018-12-19 ENCOUNTER — Ambulatory Visit: Payer: PPO | Admitting: Family Medicine

## 2019-01-25 DIAGNOSIS — E119 Type 2 diabetes mellitus without complications: Secondary | ICD-10-CM | POA: Diagnosis not present

## 2019-02-02 DIAGNOSIS — Z8639 Personal history of other endocrine, nutritional and metabolic disease: Secondary | ICD-10-CM | POA: Diagnosis not present

## 2019-02-02 DIAGNOSIS — F17210 Nicotine dependence, cigarettes, uncomplicated: Secondary | ICD-10-CM | POA: Diagnosis not present

## 2019-02-02 DIAGNOSIS — E119 Type 2 diabetes mellitus without complications: Secondary | ICD-10-CM | POA: Diagnosis not present

## 2019-02-02 DIAGNOSIS — F3341 Major depressive disorder, recurrent, in partial remission: Secondary | ICD-10-CM | POA: Diagnosis not present

## 2019-02-02 DIAGNOSIS — E785 Hyperlipidemia, unspecified: Secondary | ICD-10-CM | POA: Diagnosis not present

## 2019-02-02 DIAGNOSIS — Z794 Long term (current) use of insulin: Secondary | ICD-10-CM | POA: Diagnosis not present

## 2019-02-02 DIAGNOSIS — I10 Essential (primary) hypertension: Secondary | ICD-10-CM | POA: Diagnosis not present

## 2019-06-26 ENCOUNTER — Other Ambulatory Visit: Payer: Self-pay | Admitting: Gastroenterology

## 2019-06-26 DIAGNOSIS — K5730 Diverticulosis of large intestine without perforation or abscess without bleeding: Secondary | ICD-10-CM | POA: Diagnosis not present

## 2019-06-26 DIAGNOSIS — K5732 Diverticulitis of large intestine without perforation or abscess without bleeding: Secondary | ICD-10-CM | POA: Diagnosis not present

## 2019-06-26 DIAGNOSIS — R1032 Left lower quadrant pain: Secondary | ICD-10-CM | POA: Diagnosis not present

## 2019-06-26 DIAGNOSIS — K625 Hemorrhage of anus and rectum: Secondary | ICD-10-CM | POA: Diagnosis not present

## 2019-06-28 ENCOUNTER — Encounter: Payer: Self-pay | Admitting: Radiology

## 2019-06-28 ENCOUNTER — Other Ambulatory Visit: Payer: Self-pay

## 2019-06-28 ENCOUNTER — Ambulatory Visit
Admission: RE | Admit: 2019-06-28 | Discharge: 2019-06-28 | Disposition: A | Discharge status: Home / Self Care | Payer: PPO | Source: Ambulatory Visit | Attending: Gastroenterology | Admitting: Gastroenterology

## 2019-06-28 DIAGNOSIS — N281 Cyst of kidney, acquired: Secondary | ICD-10-CM | POA: Diagnosis not present

## 2019-06-28 DIAGNOSIS — R1032 Left lower quadrant pain: Secondary | ICD-10-CM

## 2019-06-28 MED ORDER — IOPAMIDOL (ISOVUE-300) INJECTION 61%
100.0000 mL | Freq: Once | INTRAVENOUS | Status: AC | PRN
  Administered 2019-06-28: 11:00:00 100 mL via INTRAVENOUS

## 2019-07-01 ENCOUNTER — Encounter (HOSPITAL_COMMUNITY): Payer: Self-pay | Admitting: *Deleted

## 2019-07-01 ENCOUNTER — Other Ambulatory Visit: Payer: Self-pay

## 2019-07-01 ENCOUNTER — Observation Stay (HOSPITAL_COMMUNITY)
Admission: EM | Admit: 2019-07-01 | Discharge: 2019-07-02 | Disposition: A | Discharge status: Home / Self Care | Payer: PPO | Attending: Internal Medicine | Admitting: Internal Medicine

## 2019-07-01 DIAGNOSIS — K5792 Diverticulitis of intestine, part unspecified, without perforation or abscess without bleeding: Principal | ICD-10-CM | POA: Diagnosis present

## 2019-07-01 DIAGNOSIS — Z794 Long term (current) use of insulin: Secondary | ICD-10-CM | POA: Diagnosis not present

## 2019-07-01 DIAGNOSIS — Z66 Do not resuscitate: Secondary | ICD-10-CM | POA: Diagnosis not present

## 2019-07-01 DIAGNOSIS — Z79890 Hormone replacement therapy (postmenopausal): Secondary | ICD-10-CM | POA: Diagnosis not present

## 2019-07-01 DIAGNOSIS — E039 Hypothyroidism, unspecified: Secondary | ICD-10-CM | POA: Diagnosis present

## 2019-07-01 DIAGNOSIS — Z7982 Long term (current) use of aspirin: Secondary | ICD-10-CM | POA: Insufficient documentation

## 2019-07-01 DIAGNOSIS — F17210 Nicotine dependence, cigarettes, uncomplicated: Secondary | ICD-10-CM | POA: Insufficient documentation

## 2019-07-01 DIAGNOSIS — R1030 Lower abdominal pain, unspecified: Secondary | ICD-10-CM

## 2019-07-01 DIAGNOSIS — F3341 Major depressive disorder, recurrent, in partial remission: Secondary | ICD-10-CM | POA: Diagnosis not present

## 2019-07-01 DIAGNOSIS — Z79899 Other long term (current) drug therapy: Secondary | ICD-10-CM | POA: Diagnosis not present

## 2019-07-01 DIAGNOSIS — I10 Essential (primary) hypertension: Secondary | ICD-10-CM | POA: Diagnosis not present

## 2019-07-01 DIAGNOSIS — Z20828 Contact with and (suspected) exposure to other viral communicable diseases: Secondary | ICD-10-CM | POA: Insufficient documentation

## 2019-07-01 DIAGNOSIS — E785 Hyperlipidemia, unspecified: Secondary | ICD-10-CM | POA: Insufficient documentation

## 2019-07-01 DIAGNOSIS — E118 Type 2 diabetes mellitus with unspecified complications: Secondary | ICD-10-CM | POA: Diagnosis not present

## 2019-07-01 DIAGNOSIS — K5732 Diverticulitis of large intestine without perforation or abscess without bleeding: Secondary | ICD-10-CM | POA: Diagnosis not present

## 2019-07-01 DIAGNOSIS — E119 Type 2 diabetes mellitus without complications: Secondary | ICD-10-CM | POA: Insufficient documentation

## 2019-07-01 LAB — URINALYSIS, ROUTINE W REFLEX MICROSCOPIC
Bilirubin Urine: NEGATIVE
Glucose, UA: NEGATIVE mg/dL
Hgb urine dipstick: NEGATIVE
Ketones, ur: NEGATIVE mg/dL
Nitrite: NEGATIVE
Protein, ur: NEGATIVE mg/dL
Specific Gravity, Urine: 1.01 (ref 1.005–1.030)
pH: 5 (ref 5.0–8.0)

## 2019-07-01 LAB — COMPREHENSIVE METABOLIC PANEL
ALT: 13 U/L (ref 0–44)
AST: 19 U/L (ref 15–41)
Albumin: 3.7 g/dL (ref 3.5–5.0)
Alkaline Phosphatase: 46 U/L (ref 38–126)
Anion gap: 8 (ref 5–15)
BUN: 15 mg/dL (ref 8–23)
CO2: 23 mmol/L (ref 22–32)
Calcium: 9 mg/dL (ref 8.9–10.3)
Chloride: 107 mmol/L (ref 98–111)
Creatinine, Ser: 0.97 mg/dL (ref 0.44–1.00)
GFR calc Af Amer: >60 mL/min (ref >60)
GFR calc non Af Amer: 59 mL/min — ABNORMAL LOW (ref >60)
Glucose, Bld: 119 mg/dL — ABNORMAL HIGH (ref 70–99)
Potassium: 3.8 mmol/L (ref 3.5–5.1)
Sodium: 138 mmol/L (ref 135–145)
Total Bilirubin: 0.4 mg/dL (ref 0.3–1.2)
Total Protein: 6.5 g/dL (ref 6.5–8.1)

## 2019-07-01 LAB — CBC
HCT: 36.6 % (ref 36.0–46.0)
Hemoglobin: 12 g/dL (ref 12.0–15.0)
MCH: 31.5 pg (ref 26.0–34.0)
MCHC: 32.8 g/dL (ref 30.0–36.0)
MCV: 96.1 fL (ref 80.0–100.0)
Platelets: 274 10*3/uL (ref 150–400)
RBC: 3.81 MIL/uL — ABNORMAL LOW (ref 3.87–5.11)
RDW: 12.8 % (ref 11.5–15.5)
WBC: 12.4 10*3/uL — ABNORMAL HIGH (ref 4.0–10.5)
nRBC: 0 % (ref 0.0–0.2)

## 2019-07-01 LAB — SARS CORONAVIRUS 2 BY RT PCR (HOSPITAL ORDER, PERFORMED IN ~~LOC~~ HOSPITAL LAB): SARS Coronavirus 2: NEGATIVE

## 2019-07-01 LAB — LIPASE, BLOOD: Lipase: 21 U/L (ref 11–51)

## 2019-07-01 MED ORDER — LORATADINE 10 MG PO TABS
10.0000 mg | ORAL_TABLET | Freq: Every day | ORAL | Status: DC
  Filled 2019-07-01: qty 1

## 2019-07-01 MED ORDER — SODIUM CHLORIDE 0.9% FLUSH
3.0000 mL | Freq: Two times a day (BID) | INTRAVENOUS | Status: DC
  Administered 2019-07-01: 3 mL via INTRAVENOUS

## 2019-07-01 MED ORDER — METRONIDAZOLE IN NACL 5-0.79 MG/ML-% IV SOLN
500.0000 mg | Freq: Once | INTRAVENOUS | Status: AC
Start: 2019-07-01 — End: 2019-07-01
  Administered 2019-07-01: 18:00:00 500 mg via INTRAVENOUS
  Filled 2019-07-01: qty 100

## 2019-07-01 MED ORDER — SERTRALINE HCL 50 MG PO TABS
50.0000 mg | ORAL_TABLET | Freq: Every day | ORAL | Status: DC
  Administered 2019-07-01: 50 mg via ORAL
  Filled 2019-07-01: qty 1

## 2019-07-01 MED ORDER — SODIUM CHLORIDE 0.9 % IV SOLN: 250.0000 mL | INTRAVENOUS | Status: DC | PRN

## 2019-07-01 MED ORDER — SODIUM CHLORIDE 0.9% FLUSH: 3.0000 mL | Freq: Once | INTRAVENOUS | Status: DC

## 2019-07-01 MED ORDER — CIPROFLOXACIN IN D5W 400 MG/200ML IV SOLN
400.0000 mg | Freq: Once | INTRAVENOUS | Status: AC
  Administered 2019-07-01: 20:00:00 400 mg via INTRAVENOUS
  Filled 2019-07-01: qty 200

## 2019-07-01 MED ORDER — FENTANYL CITRATE (PF) 100 MCG/2ML IJ SOLN
50.0000 ug | INTRAMUSCULAR | Status: DC | PRN
Start: 2019-07-01 — End: 2019-07-02
  Administered 2019-07-01: 50 ug via INTRAVENOUS
  Filled 2019-07-01: qty 2

## 2019-07-01 MED ORDER — ENOXAPARIN SODIUM 40 MG/0.4ML ~~LOC~~ SOLN
40.0000 mg | SUBCUTANEOUS | Status: DC
  Administered 2019-07-01: 40 mg via SUBCUTANEOUS
  Filled 2019-07-01 (×3): qty 0.4

## 2019-07-01 MED ORDER — LOSARTAN POTASSIUM 50 MG PO TABS
50.0000 mg | ORAL_TABLET | Freq: Every day | ORAL | Status: DC
  Administered 2019-07-01: 23:00:00 50 mg via ORAL
  Filled 2019-07-01: qty 1

## 2019-07-01 MED ORDER — SODIUM CHLORIDE 0.9 % IV BOLUS
500.0000 mL | Freq: Once | INTRAVENOUS | Status: AC
  Administered 2019-07-01: 500 mL via INTRAVENOUS

## 2019-07-01 MED ORDER — SODIUM CHLORIDE 0.9% FLUSH: 3.0000 mL | INTRAVENOUS | Status: DC | PRN

## 2019-07-01 MED ORDER — TRAMADOL HCL 50 MG PO TABS: 50.0000 mg | ORAL_TABLET | Freq: Four times a day (QID) | ORAL | Status: DC | PRN

## 2019-07-01 MED ORDER — INSULIN ASPART 100 UNIT/ML ~~LOC~~ SOLN
0.0000 [IU] | Freq: Three times a day (TID) | SUBCUTANEOUS | Status: DC
  Administered 2019-07-02: 13:00:00 3 [IU] via SUBCUTANEOUS

## 2019-07-01 MED ORDER — ASPIRIN 81 MG PO CHEW
81.0000 mg | CHEWABLE_TABLET | Freq: Every day | ORAL | Status: DC
  Administered 2019-07-01: 23:00:00 81 mg via ORAL
  Filled 2019-07-01: qty 1

## 2019-07-01 MED ORDER — ACETAMINOPHEN 500 MG PO TABS: 500.0000 mg | ORAL_TABLET | Freq: Four times a day (QID) | ORAL | Status: DC | PRN

## 2019-07-01 MED ORDER — INSULIN GLARGINE 100 UNIT/ML ~~LOC~~ SOLN
10.0000 [IU] | Freq: Every day | SUBCUTANEOUS | Status: DC
  Administered 2019-07-02: 10:00:00 10 [IU] via SUBCUTANEOUS
  Filled 2019-07-01: qty 0.1

## 2019-07-01 MED ORDER — ALPRAZOLAM 0.25 MG PO TABS: 0.2500 mg | ORAL_TABLET | ORAL | Status: DC | PRN

## 2019-07-01 NOTE — H&P (Signed)
History and Physical    Patricia Mills [id number] DOB: 1946-04-07 DOA: 07/01/2019  PCP: Heywood Bene, PA-C (Confirm with patient/family/NH records and if not entered, this has to be entered at [facility] point of entry) Patient coming from: patient is coming from home  I have personally briefly reviewed patient's old medical records in [place]  Chief Complaint: 9 Days of abdominal pain  HPI: Patricia Mills is a 73 y.o. female with medical history significant of DM, HTN, HDL, hypothyroidism  who comes in with a 6-day history of abdominal pain.  Patient states that the pain was generalized initially and has migrated to the suprapubic area.  Patient states that this was associated with no nausea or vomiting.  Patient states she has diarrhea at baseline.  Patient underwent CT scan as an outpatient per Dr. Collene Mares.  Patient was told she had an area of diverticulitis as well as a dilated appendix.She was started Friday night, 06/29/19 on Cipro and Flagyl. Patient states that pain continued.  She presented to the ER for further evaluation.    ED Course: Upon evaluation in the ER she underwent laboratory studies.  This was significant for a leukocytosis of 12.4. The patient's CT scan from 9/17 which was consistent with likely diverticulitis in the sigmoid colon.  Enlarged appendix. Surgical consult opined that this was unlikely to be appendicitis and recommended in-patient tx with IV abx for diverticulitis that failed outpatient abx. She is referred to [facility] for admission  Review of Systems: As per HPI otherwise 10 point review of systems negative.    Past Medical History:  Diagnosis Date  . Diabetes mellitus without complication ([place])   . Hypertension   . Thyroid disease     Past Surgical History:  Procedure Laterality Date  . ABDOMINAL HYSTERECTOMY    . CHOLECYSTECTOMY    . TONSILLECTOMY     Soc Hx  - married then divorced 40 years ago. Two daughters, no grandchildren. 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 She lives alone and is fully I-ADLs.   reports that she has been smoking cigarettes. She has a 10.00 pack-year smoking history. She has never used smokeless tobacco. She reports that she does not drink alcohol or use drugs.  Allergies  Allergen Reactions  . Metformin Diarrhea  . Penicillins Itching and Other (See Comments)    Burning sensation.    Family History  Problem Relation Age of Onset  . Stroke Mother      Prior to Admission medications   Medication Sig Start Date End Date Taking? Authorizing Provider  ALPRAZolam Duanne Moron) 0.5 MG tablet Take 0.25 mg by mouth as needed for anxiety. 01/03/18  Yes [provider]  aspirin 81 MG chewable tablet Chew 81 mg by mouth at bedtime.    Yes [provider]  Insulin Glargine (LANTUS SOLOSTAR) 100 UNIT/ML Solostar Pen Inject 10 Units into the skin daily.  01/03/18  Yes [provider]  losartan (COZAAR) 50 MG tablet Take 50 mg by mouth at bedtime.    Yes [provider]  sertraline (ZOLOFT) 50 MG tablet Take 50 mg by mouth at bedtime.    Yes [provider]    Physical Exam: Vitals:   07/01/19 1548 07/01/19 1549  BP: (!) 162/54   Pulse: 85   Resp: 14   Temp: 98 F (36.7 C)   TempSrc: Oral   SpO2: 100%   Weight:  90.7 kg  Height:  5' 8.5" (1.74 m)  Constitutional: NAD, calm, comfortable Vitals:   07/01/19 1548 07/01/19 1549  BP: (!) 162/54   Pulse: 85   Resp: 14   Temp: 98 F (36.7 C)   TempSrc: Oral   SpO2: 100%   Weight:  90.7 kg  Height:  5' 8.5" (1.74 m)   General Appearance: WNWD woman in no distress. A good historian. Eyes: PERRL, lids and conjunctivae normal ENMT: Mucous membranes are moist. Posterior pharynx clear of any exudate or lesions.Normal dentition.  Neck: normal, supple, no masses, no thyromegaly Respiratory: clear to auscultation bilaterally, no wheezing, no crackles. Normal respiratory effort. No accessory muscle  use.  Cardiovascular: Regular rate and rhythm, III/VI systolic mm at RSB  / no rubs /no  gallops. No extremity edema. 2+ pedal pulses. No carotid bruits.  Abdomen:  Bowel sounds positive. No Heaptosplenomegaly. No guarding or rebound tenderness. Tenderness worst at LLQ to deep palpation. Musculoskeletal: no clubbing / cyanosis. No joint deformity upper and lower extremities. Good ROM, no contractures. Normal muscle tone.  Skin: no rashes, lesions, ulcers. No induration Neurologic: CN 2-12 grossly intact. Sensation intact, DTR normal. Strength 5/5 in all 4.  Psychiatric: Normal judgment and insight. Alert and oriented x 3. Normal mood.     Labs on Admission: I have personally reviewed following labs and imaging studies  CBC: Recent Labs  Lab 07/01/19 1607  WBC 12.4*  HGB 12.0  HCT 36.6  MCV 96.1  PLT 123456   Basic Metabolic Panel: Recent Labs  Lab 07/01/19 1607  NA 138  K 3.8  CL 107  CO2 23  GLUCOSE 119*  BUN 15  CREATININE 0.97  CALCIUM 9.0   GFR: Estimated Creatinine Clearance: 63.2 mL/min (by C-G formula based on SCr of 0.97 mg/dL). Liver Function Tests: Recent Labs  Lab 07/01/19 1607  AST 19  ALT 13  ALKPHOS 46  BILITOT 0.4  PROT 6.5  ALBUMIN 3.7   Recent Labs  Lab 07/01/19 1607  LIPASE 21   No results for input(s): AMMONIA in the last 168 hours. Coagulation Profile: No results for input(s): INR, PROTIME in the last 168 hours. Cardiac Enzymes: No results for input(s): CKTOTAL, CKMB, CKMBINDEX, TROPONINI in the last 168 hours. BNP (last 3 results) No results for input(s): PROBNP in the last 8760 hours. HbA1C: No results for input(s): HGBA1C in the last 72 hours. CBG: No results for input(s): GLUCAP in the last 168 hours. Lipid Profile: No results for input(s): CHOL, HDL, LDLCALC, TRIG, CHOLHDL, LDLDIRECT in the last 72 hours. Thyroid Function Tests: No results for input(s): TSH, T4TOTAL, FREET4, T3FREE, THYROIDAB in the last 72 hours. Anemia Panel:  No results for input(s): VITAMINB12, FOLATE, FERRITIN, TIBC, IRON, RETICCTPCT in the last 72 hours. Urine analysis: No results found for: COLORURINE, APPEARANCEUR, LABSPEC, PHURINE, GLUCOSEU, HGBUR, BILIRUBINUR, KETONESUR, PROTEINUR, UROBILINOGEN, NITRITE, LEUKOCYTESUR  Radiological Exams on Admission: No results found.  EKG: Independently reviewed. No EKG  Assessment/Plan Active Problems:   Diverticulitis   Essential hypertension   Type 2 diabetes mellitus with complication, without long-term current use of insulin (HCC)   Hypothyroidism  (please populate well all problems here in Problem List. (For example, if patient is on BP meds at home and you resume or decide to hold them, it is a problem that needs to be her. Same for CAD, COPD, HLD and so on)   1. Diverticulitis - patient with relatively moderate symptoms w/o vomiting, hematochezia, fever. Modes leukocytosis. CT with inflammation and stranding w/o evidence abcess. Patient has had 36  hr oral abx. She does live alone. Surgery recommended IV abx to be sure she is stable. Plan Medical admit  IV cipro and Flagyl x 24 hrs  CBC in AM  May be for early d/c 9/21 or 9/22 AM  2. DM - patient on basal insulin and oral medication. She reports last A1C was < 7%. Plan Glycemic protocol. No A1C as she has been tested in the last 90 days  Continue home Lantus plus sliding scale. Hold glimiperide  3. HTN - continue home meds  4. Hypothyroidism - stable  5. Aortic mm - new to patient. Can be followed as outpatient by PCP - may benefit from baseline echo  DVT prophylaxis: lovenox (Lovenox/Heparin/SCD's/anticoagulated/None (if comfort care) Code Status: DNR/DNI (Full/Partial (specify details) Family Communication: spoke with daughter Patricia Mills - answered all questions. Referred to "[email or web address]" (Specify name, relationship. Do not write "discussed with patient". Specify tel # if discussed over the phone) Disposition Plan: home  36-48 hrs (specify when and where you expect patient to be discharged) Consults called: GS Gwenlyn Fudge (with names) Admission status: obs (inpatient / obs / tele / medical floor / SDU)   Adella Hare MD [facility] Pager [phone]  If 7PM-7AM, please contact night-coverage [email or web address] Password [facility]  07/01/2019, 6:27 PM

## 2019-07-01 NOTE — ED Provider Notes (Signed)
[facility] EMERGENCY DEPARTMENT Provider Note   CSN: [id number] Arrival date & time: 07/01/19  1543     History   Chief Complaint Chief Complaint  Patient presents with  . Abdominal Pain    HPI Patricia Mills is a 73 y.o. female.     Patient presents with worsening lower abdominal pain since Tuesday.  Patient was seen by primary doctor Dr. Collene Mares and had CT scan performed however patient has not received results.  Patient's had no fevers no vomiting mild diarrhea.  Patient hysterectomy history.  Patient is a decreased appetite the past few days.  Pain worse central lower.     Past Medical History:  Diagnosis Date  . Diabetes mellitus without complication ([place])   . Hypertension   . Thyroid disease     Patient Active Problem List   Diagnosis Date Noted  . Syncope 10/06/2018  . Essential hypertension 10/06/2018  . Hyperlipidemia 10/06/2018  . Type 2 diabetes mellitus with complication, without long-term current use of insulin ([place]) 10/06/2018  . Degeneration of lumbar intervertebral disc 10/18/2017  . Hypothyroidism 11/28/2015  . Recurrent major depressive disorder, in partial remission ([place]) 11/28/2015    Past Surgical History:  Procedure Laterality Date  . ABDOMINAL HYSTERECTOMY    . CHOLECYSTECTOMY    . TONSILLECTOMY       OB History   No obstetric history on file.      Home Medications    Prior to Admission medications   Medication Sig Start Date End Date Taking? Authorizing Provider  aspirin 81 MG chewable tablet Chew by mouth daily.    [provider]  azithromycin (ZITHROMAX) 250 MG tablet Take 1 tablet (250 mg total) by mouth daily. Take first 2 tablets together, then 1 every day until finished. 10/12/18   Noe Gens, PA-C  esomeprazole (NEXIUM) 20 MG packet Take 20 mg by mouth daily before breakfast.    [provider]  Insulin Glargine (LANTUS SOLOSTAR) 100 UNIT/ML Solostar Pen INJECT 5 UNITS SUBCUTANEOUSLY AT  NIGHT 01/03/18   [provider]  insulin NPH-regular Human (NOVOLIN 70/30) (70-30) 100 UNIT/ML injection Inject into the skin.    [provider]  levothyroxine (SYNTHROID, LEVOTHROID) 25 MCG tablet Take 25 mcg by mouth daily before breakfast.    [provider]  LORazepam (ATIVAN) 0.5 MG tablet Take 0.5 mg by mouth every 8 (eight) hours.    [provider]  losartan (COZAAR) 50 MG tablet Take 50 mg by mouth daily.    [provider]  predniSONE (DELTASONE) 20 MG tablet 3 tabs po day one, then 2 po daily x 4 days 10/12/18   Noe Gens, PA-C  sertraline (ZOLOFT) 50 MG tablet Take 50 mg by mouth daily.    [provider]  sitaGLIPtin (JANUVIA) 100 MG tablet Take by mouth. 09/19/18   [provider]  sitaGLIPtin-metformin (JANUMET) 50-500 MG tablet Take 1 tablet by mouth 2 (two) times daily with a meal.    [provider]    Family History Family History  Problem Relation Age of Onset  . Stroke Mother     Social History Social History   Tobacco Use  . Smoking status: Current Every Day Smoker    Packs/day: 0.50    Years: 20.00    Pack years: 10.00    Types: Cigarettes  . Smokeless tobacco: Never Used  Substance Use Topics  . Alcohol use: No  . Drug use: No  Allergies   Penicillins   Review of Systems Review of Systems  Constitutional: Negative for chills and fever.  HENT: Negative for congestion.   Eyes: Negative for visual disturbance.  Respiratory: Negative for shortness of breath.   Cardiovascular: Negative for chest pain.  Gastrointestinal: Positive for abdominal pain and nausea. Negative for vomiting.  Genitourinary: Negative for dysuria and flank pain.  Musculoskeletal: Negative for back pain, neck pain and neck stiffness.  Skin: Negative for rash.  Neurological: Negative for light-headedness and headaches.     Physical Exam Updated Vital Signs BP (!) 162/54 (BP Location: Right Arm)    Pulse 85   Temp 98 F (36.7 C) (Oral)   Resp 14   Ht 5' 8.5" (1.74 m)   Wt 90.7 kg   SpO2 100%   BMI 29.97 kg/m   Physical Exam Vitals signs and nursing note reviewed.  Constitutional:      Appearance: She is well-developed.  HENT:     Head: Normocephalic and atraumatic.  Eyes:     General:        Right eye: No discharge.        Left eye: No discharge.     Conjunctiva/sclera: Conjunctivae normal.  Neck:     Musculoskeletal: Normal range of motion and neck supple.     Trachea: No tracheal deviation.  Cardiovascular:     Rate and Rhythm: Normal rate and regular rhythm.  Pulmonary:     Effort: Pulmonary effort is normal.     Breath sounds: Normal breath sounds.  Abdominal:     General: There is no distension.     Palpations: Abdomen is soft.     Tenderness: There is abdominal tenderness (lower abdo). There is no guarding.  Skin:    General: Skin is warm.     Findings: No rash.  Neurological:     Mental Status: She is alert and oriented to person, place, and time.      ED Treatments / Results  Labs (all labs ordered are listed, but only abnormal results are displayed) Labs Reviewed  COMPREHENSIVE METABOLIC PANEL - Abnormal; Notable for the following components:      Result Value   Glucose, Bld 119 (*)    GFR calc non Af Amer 59 (*)    All other components within normal limits  CBC - Abnormal; Notable for the following components:   WBC 12.4 (*)    RBC 3.81 (*)    All other components within normal limits  SARS CORONAVIRUS 2 (HOSPITAL ORDER, [facility])  LIPASE, BLOOD  URINALYSIS, ROUTINE W REFLEX MICROSCOPIC    EKG None  Radiology No results found.  Procedures Procedures (including critical care time)  Medications Ordered in ED Medications  sodium chloride flush (NS) 0.9 % injection 3 mL (has no administration in time range)  ciprofloxacin (CIPRO) IVPB 400 mg (has no administration in time range)  metroNIDAZOLE (FLAGYL)  IVPB 500 mg (500 mg Intravenous New Bag/Given 07/01/19 1730)  fentaNYL (SUBLIMAZE) injection 50 mcg (50 mcg Intravenous Given 07/01/19 1727)  sodium chloride 0.9 % bolus 500 mL (500 mLs Intravenous New Bag/Given 07/01/19 1726)     Initial Impression / Assessment and Plan / ED Course  I have reviewed the triage vital signs and the nursing notes.  Pertinent labs & imaging results that were available during my care of the patient were reviewed by me and considered in my medical decision making (see chart for details).  Patient presents with gradually worsening lower abdominal pain since Tuesday.  Reviewed CT scan results that were done outpatient with patient concerning for early appendicitis and possibly diverticulitis.  IV antibiotics ordered, IV fluids, pain meds and blood work reviewed.  Discussed with general surgery for consult.  Normal hemoglobin, mild elevated white blood cell count 12.4, normal kidney function.  Patient is not on blood thinning medications.  Patient has diabetes history.  Patient's primary pain is lower central abdomen.  Discussed with general surgery who evaluated the patient in the ER.  Most likely diagnosis diverticulitis and plan for admission to medicine for IV antibiotics and monitoring.  The patients results and plan were reviewed and discussed.   Any x-rays performed were independently reviewed by myself.   Differential diagnosis were considered with the presenting HPI.  Medications  sodium chloride flush (NS) 0.9 % injection 3 mL (has no administration in time range)  ciprofloxacin (CIPRO) IVPB 400 mg (has no administration in time range)  metroNIDAZOLE (FLAGYL) IVPB 500 mg (500 mg Intravenous New Bag/Given 07/01/19 1730)  fentaNYL (SUBLIMAZE) injection 50 mcg (50 mcg Intravenous Given 07/01/19 1727)  sodium chloride 0.9 % bolus 500 mL (500 mLs Intravenous New Bag/Given 07/01/19 1726)    Vitals:   07/01/19 1548 07/01/19 1549  BP: (!) 162/54   Pulse: 85    Resp: 14   Temp: 98 F (36.7 C)   TempSrc: Oral   SpO2: 100%   Weight:  90.7 kg  Height:  5' 8.5" (1.74 m)    Final diagnoses:  Lower abdominal pain  Acute diverticulitis of intestine    Admission/ observation were discussed with the admitting physician, patient and/or family and they are comfortable with the plan.    Final Clinical Impressions(s) / ED Diagnoses   Final diagnoses:  Lower abdominal pain  Acute diverticulitis of intestine    ED Discharge Orders    None       Elnora Morrison, MD 07/01/19 1810

## 2019-07-01 NOTE — ED Notes (Signed)
Dr. Rosendo Gros in to assess pt at this time

## 2019-07-01 NOTE — ED Notes (Signed)
CBG 127-Glucometer not crossing data over

## 2019-07-01 NOTE — ED Triage Notes (Signed)
Lower abdominal pain that started on Tuesday. She was seen by her provider (was seen by dr. Collene Mares), CT scan was complete, but pt has not received results (results showing in epic). No fevers, no vomiting, some diarrhea (reports as "normal" for her).

## 2019-07-01 NOTE — Consult Note (Signed)
Reason for Consult: Abdominal pain Referring Physician: Dr. Federico Flake is an 73 y.o. female.  HPI: Patient is a 73 year old female, with a history of hypertension, diabetes, who comes in with a 6-day history of abdominal pain.  Patient states that the pain was generalized initially and has migrated to the suprapubic area.  Patient states that this was associated with no nausea or vomiting.  Patient states she has diarrhea at baseline.  Patient underwent CT scan as an outpatient per Dr. Collene Mares.  Patient was told she had an area of diverticulitis as well as a dilated appendix. Patient states that pain continued.  She presented to the ER for further evaluation.  Upon evaluation in the ER she underwent laboratory studies.  This was significant for a leukocytosis of 12.4.  I reviewed the patient's CT scan from 9/17 which was consistent with likely diverticulitis in the sigmoid colon.  Enlarged appendix.  Patient states that she has had no right-sided abdominal pain in the past. Patient's had a previous cholecystectomy, hysterectomy.  Past Medical History:  Diagnosis Date  . Diabetes mellitus without complication ([place])   . Hypertension   . Thyroid disease     Past Surgical History:  Procedure Laterality Date  . ABDOMINAL HYSTERECTOMY    . CHOLECYSTECTOMY    . TONSILLECTOMY      Family History  Problem Relation Age of Onset  . Stroke Mother     Social History:  reports that she has been smoking cigarettes. She has a 10.00 pack-year smoking history. She has never used smokeless tobacco. She reports that she does not drink alcohol or use drugs.  Allergies:  Allergies  Allergen Reactions  . Metformin Diarrhea  . Penicillins Itching and Other (See Comments)    Burning sensation.    Medications: I have reviewed the patient's current medications.  Results for orders placed or performed during the hospital encounter of 07/01/19 (from the past 48 hour(s))  Lipase, blood      Status: None   Collection Time: 07/01/19  4:07 PM  Result Value Ref Range   Lipase 21 11 - 51 U/L    Comment: Performed at [facility], [place] [address] [place], [place] [address]  Comprehensive metabolic panel     Status: Abnormal   Collection Time: 07/01/19  4:07 PM  Result Value Ref Range   Sodium 138 135 - 145 mmol/L   Potassium 3.8 3.5 - 5.1 mmol/L   Chloride 107 98 - 111 mmol/L   CO2 23 22 - 32 mmol/L   Glucose, Bld 119 (H) 70 - 99 mg/dL   BUN 15 8 - 23 mg/dL   Creatinine, Ser 0.97 0.44 - 1.00 mg/dL   Calcium 9.0 8.9 - 10.3 mg/dL   Total Protein 6.5 6.5 - 8.1 g/dL   Albumin 3.7 3.5 - 5.0 g/dL   AST 19 15 - 41 U/L   ALT 13 0 - 44 U/L   Alkaline Phosphatase 46 38 - 126 U/L   Total Bilirubin 0.4 0.3 - 1.2 mg/dL   GFR calc non Af Amer 59 (L) >60 mL/min   GFR calc Af Amer >60 >60 mL/min   Anion gap 8 5 - 15    Comment: Performed at [facility] [address] [place], [place] [address]  CBC     Status: Abnormal   Collection Time: 07/01/19  4:07 PM  Result Value Ref Range   WBC 12.4 (H) 4.0 - 10.5 K/uL  RBC 3.81 (L) 3.87 - 5.11 MIL/uL   Hemoglobin 12.0 12.0 - 15.0 g/dL   HCT 36.6 36.0 - 46.0 %   MCV 96.1 80.0 - 100.0 fL   MCH 31.5 26.0 - 34.0 pg   MCHC 32.8 30.0 - 36.0 g/dL   RDW 12.8 11.5 - 15.5 %   Platelets 274 150 - 400 K/uL   nRBC 0.0 0.0 - 0.2 %    Comment: Performed at [facility] [address], [place], [place] [address]    No results found.  Review of Systems  Constitutional: Negative for chills, fever and malaise/fatigue.  HENT: Negative for ear discharge, hearing loss and sore throat.   Eyes: Negative for blurred vision and discharge.  Respiratory: Negative for cough and shortness of breath.   Cardiovascular: Negative for chest pain, orthopnea and leg swelling.  Gastrointestinal: Positive for abdominal pain and diarrhea. Negative for constipation, heartburn, nausea and vomiting.  Musculoskeletal: Negative for myalgias and neck  pain.  Skin: Negative for itching and rash.  Neurological: Negative for dizziness, focal weakness, seizures and loss of consciousness.  Endo/Heme/Allergies: Negative for environmental allergies. Does not bruise/bleed easily.  Psychiatric/Behavioral: Negative for depression and suicidal ideas.  All other systems reviewed and are negative.  Blood pressure (!) 162/54, pulse 85, temperature 98 F (36.7 C), temperature source Oral, resp. rate 14, height 5' 8.5" (1.74 m), weight 90.7 kg, SpO2 100 %. Physical Exam  Constitutional: She is oriented to person, place, and time. Vital signs are normal. She appears well-developed and well-nourished.  Conversant No acute distress  Eyes: Lids are normal. No scleral icterus.  No lid lag Moist conjunctiva  Neck: No tracheal tenderness present. No thyromegaly present.  No cervical lymphadenopathy  Cardiovascular: Normal rate, regular rhythm and intact distal pulses.  No murmur heard. Respiratory: Effort normal and breath sounds normal. She has no wheezes. She has no rales.  GI: There is no hepatosplenomegaly. There is abdominal tenderness in the suprapubic area. There is no rigidity, no rebound and no guarding. No hernia.  Neurological: She is alert and oriented to person, place, and time.  Normal gait and station  Skin: Skin is warm. No rash noted. No cyanosis. Nails show no clubbing.  Normal skin turgor  Psychiatric: Judgment normal.  Appropriate affect    Assessment/Plan: 73 year old female with sigmoid diverticulitis. Hypertension Diabetes  1.  Patient's current condition likely supportive for sigmoid diverticulitis per CT scan and exam.  Would recommend IV antibiotics, n.p.o., IV fluids. 2.  We will continue to follow along.  I did discuss with the patient that if things potentially got worse she potentially would require Hartman's procedure and colostomy.  Patient voiced understanding.  Ralene Ok 07/01/2019, 6:18 PM

## 2019-07-02 DIAGNOSIS — K5732 Diverticulitis of large intestine without perforation or abscess without bleeding: Secondary | ICD-10-CM | POA: Diagnosis not present

## 2019-07-02 LAB — CBC
HCT: 32.9 % — ABNORMAL LOW (ref 36.0–46.0)
Hemoglobin: 10.7 g/dL — ABNORMAL LOW (ref 12.0–15.0)
MCH: 31.6 pg (ref 26.0–34.0)
MCHC: 32.5 g/dL (ref 30.0–36.0)
MCV: 97.1 fL (ref 80.0–100.0)
Platelets: 215 10*3/uL (ref 150–400)
RBC: 3.39 MIL/uL — ABNORMAL LOW (ref 3.87–5.11)
RDW: 12.8 % (ref 11.5–15.5)
WBC: 10 10*3/uL (ref 4.0–10.5)
nRBC: 0 % (ref 0.0–0.2)

## 2019-07-02 LAB — CBG MONITORING, ED
Glucose-Capillary: 127 mg/dL — ABNORMAL HIGH (ref 70–99)
Glucose-Capillary: 124 mg/dL — ABNORMAL HIGH (ref 70–99)
Glucose-Capillary: 170 mg/dL — ABNORMAL HIGH (ref 70–99)
Glucose-Capillary: 190 mg/dL — ABNORMAL HIGH (ref 70–99)

## 2019-07-02 MED ORDER — TRAMADOL HCL 50 MG PO TABS: 50.0000 mg | ORAL_TABLET | Freq: Four times a day (QID) | ORAL | 0 refills | Status: AC | PRN

## 2019-07-02 MED ORDER — METRONIDAZOLE IN NACL 5-0.79 MG/ML-% IV SOLN
500.0000 mg | Freq: Three times a day (TID) | INTRAVENOUS | Status: DC
  Administered 2019-07-02 (×2): 500 mg via INTRAVENOUS
  Filled 2019-07-02 (×2): qty 100

## 2019-07-02 MED ORDER — CIPROFLOXACIN IN D5W 400 MG/200ML IV SOLN
400.0000 mg | Freq: Two times a day (BID) | INTRAVENOUS | Status: DC
  Administered 2019-07-02: 09:00:00 400 mg via INTRAVENOUS
  Filled 2019-07-02: qty 200

## 2019-07-02 NOTE — ED Notes (Signed)
Breakfast Ordered 

## 2019-07-02 NOTE — Progress Notes (Signed)
Pharmacy Antibiotic Note  Patricia Mills is a 73 y.o. female admitted on 07/01/2019 with diverticulitis.  Pharmacy has been consulted for Cipro dosing.  Plan: Cipro 400mg  IV Q12H.  Height: 5' 8.5" (174 cm) Weight: 200 lb (90.7 kg) IBW/kg (Calculated) : 65.05  Temp (24hrs), Avg:98 F (36.7 C), Min:98 F (36.7 C), Max:98 F (36.7 C)  Recent Labs  Lab 07/01/19 1607  WBC 12.4*  CREATININE 0.97    Estimated Creatinine Clearance: 63.2 mL/min (by C-G formula based on SCr of 0.97 mg/dL).    Allergies  Allergen Reactions  . Metformin Diarrhea  . Penicillins Itching and Other (See Comments)    Burning sensation.    Thank you for allowing pharmacy to be a part of this patient's care.  Wynona Neat, PharmD, BCPS  07/02/2019 3:10 AM

## 2019-07-02 NOTE — Discharge Planning (Signed)
Discharge Summary  Patricia Mills [id number] DOB: [birth year]  PCP: Heywood Bene, PA-C  Admit date: 07/01/2019 Discharge date: 07/02/2019  Time spent: 30 mins   Recommendations for Outpatient Follow-up:  1. Follow-up with PCP in 1 week 2. Follow-up with GI, Dr. Collene Mares as scheduled  Discharge Diagnoses:  Active Hospital Problems   Diagnosis Date Noted   Diverticulitis 07/01/2019   Essential hypertension 10/06/2018   Type 2 diabetes mellitus with complication, without long-term current use of insulin ([place]) 10/06/2018   Hypothyroidism 11/28/2015    Resolved Hospital Problems  No resolved problems to display.    Discharge Condition: Stable  Diet recommendation: Soft diet  Vitals:   07/02/19 1000 07/02/19 1321  BP: 137/71 (!) 124/55  Pulse: 64   Resp:    Temp:    SpO2: 95%     History of present illness:  Patricia Mills is a 73 y.o. female with medical history significant of DM, HTN, HDL, hypothyroidism  who comes in with a 6-day history of worsening abdominal pain. Patient states that the pain was generalized initially and has migrated to the suprapubic area. Patient states that this was associated with no nausea or vomiting. Patient states she has diarrhea at baseline. Patient underwent CT scan as an outpatient per Dr. Collene Mares. Patient was told she had an area of diverticulitis as well as a dilated appendix. She was started Friday night, 06/29/19 on Cipro and Flagyl.  In the ED, patient was noted to have leukocytosis, CT scan from 9/17which was consistent with likely diverticulitis in the sigmoid colon. Enlarged appendix. Surgery was consulted, unlikely appendicitis.  Patient admitted for further management.   Today, patient reports feeling much better, denies any worsening abdominal pain, nausea/vomiting, fever/chills.  Patient was able to tolerate a soft diet without worsening abdominal pain nausea or vomiting.  Patient has a history of chronic diarrhea, which  has not worsened.  General surgery saw patient and recommended discharge with close follow-up with PCP as well as patient's GI Dr. Collene Mares.  Hospital Course:  Active Problems:   Essential hypertension   Type 2 diabetes mellitus with complication, without long-term current use of insulin ([facility])   Hypothyroidism   Diverticulitis  Acute diverticulitis Afebrile, resolved leukocytosis CT as mentioned above General surgery, unlikely appendicitis can discharge after tolerating a soft diet with 10 days of antibiotics Patient advised to continue her antibiotics, ciprofloxacin and Flagyl which was prescribed for 30 days, patient advised to take 10 days worth. Pain management Patient advised to follow-up closely with her GI Dr. Collene Mares as well as her PCP If worsening symptoms, patient should come to the ER. Advised to eat soft diet  Diabetes mellitus type 2 Continue home regimen  Hypertension Continue home regimen           Malnutrition Type:      Malnutrition Characteristics:      Nutrition Interventions:      Estimated body mass index is 29.97 kg/m as calculated from the following:   Height as of this encounter: 5' 8.5" (1.74 m).   Weight as of this encounter: 90.7 kg.    Procedures:  None  Consultations:  General surgery  Discharge Exam: BP (!) 124/55 (BP Location: Right Arm)    Pulse 64    Temp 98 F (36.7 C) (Oral)    Resp 16    Ht 5' 8.5" (1.74 m)    Wt 90.7 kg    SpO2 95%    BMI 29.97 kg/m  General: NAD Cardiovascular: S1, S2 present Respiratory: CTA B  Discharge Instructions You were cared for by a hospitalist during your hospital stay. If you have any questions about your discharge medications or the care you received while you were in the hospital after you are discharged, you can call the unit and asked to speak with the hospitalist on call if the hospitalist that took care of you is not available. Once you are discharged, your primary care physician  will handle any further medical issues. Please note that NO REFILLS for any discharge medications will be authorized once you are discharged, as it is imperative that you return to your primary care physician (or establish a relationship with a primary care physician if you do not have one) for your aftercare needs so that they can reassess your need for medications and monitor your lab values.   Allergies as of 07/02/2019      Reactions   Metformin Diarrhea   Penicillins Itching, Other (See Comments)   Burning sensation.      Medication List    TAKE these medications   acetaminophen 500 MG tablet Commonly known as: TYLENOL Take 500 mg by mouth every 6 (six) hours as needed for moderate pain.   albuterol 108 (90 Base) MCG/ACT inhaler Commonly known as: VENTOLIN HFA Inhale 2 puffs into the lungs every 6 (six) hours as needed for wheezing.   ALPRAZolam 0.5 MG tablet Commonly known as: XANAX Take 0.25 mg by mouth as needed for anxiety.   aspirin 81 MG chewable tablet Chew 81 mg by mouth at bedtime.   calcium carbonate 600 MG Tabs tablet Commonly known as: OS-CAL Take 600 mg by mouth daily.   ciprofloxacin 500 MG tablet Commonly known as: CIPRO Take 500 mg by mouth every 12 (twelve) hours.   glipiZIDE 5 MG 24 hr tablet Commonly known as: GLUCOTROL XL Take 5 mg by mouth at bedtime.   Lantus SoloStar 100 UNIT/ML Solostar Pen Generic drug: Insulin Glargine Inject 10 Units into the skin daily.   loratadine 10 MG tablet Commonly known as: CLARITIN Take 10 mg by mouth daily.   losartan 50 MG tablet Commonly known as: COZAAR Take 50 mg by mouth at bedtime.   metroNIDAZOLE 250 MG tablet Commonly known as: FLAGYL Take 250 mg by mouth 3 (three) times daily.   Omega-3 1000 MG Caps Take 1 g by mouth daily.   OneTouch Delica Plus 0000000 Misc 4 (four) times daily.   OneTouch Ultra test strip Generic drug: glucose blood 2 (two) times daily.   sertraline 50 MG  tablet Commonly known as: ZOLOFT Take 50 mg by mouth at bedtime.   traMADol 50 MG tablet Commonly known as: ULTRAM Take 1 tablet (50 mg total) by mouth every 6 (six) hours as needed for up to 7 days for moderate pain.   Vitamin D-1000 Max St 25 MCG (1000 UT) tablet Generic drug: Cholecalciferol Take 1,000 Units by mouth daily.      Allergies  Allergen Reactions   Metformin Diarrhea   Penicillins Itching and Other (See Comments)    Burning sensation.   Follow-up Information    Juanita Craver, MD Follow up.   Specialty: Gastroenterology Contact information: [address] [place] [address] [phone]        Heywood Bene, [place]. Call.   Specialty: Physician Assistant Why: Call and schedule a follow up visit in 1-2 weeks regarding diverticulitis.  Contact information: [address] [place]  [place] [address] [phone]            The results of significant diagnostics from this hospitalization (including imaging, microbiology, ancillary and laboratory) are listed below for reference.    Significant Diagnostic Studies: Ct Abdomen Pelvis W Contrast  Result Date: 06/28/2019 CLINICAL DATA:  History of low pelvic and left lower quadrant pain, similar to prior episode of diverticulitis with 5 days current duration. EXAM: CT ABDOMEN AND PELVIS WITH CONTRAST TECHNIQUE: Multidetector CT imaging of the abdomen and pelvis was performed using the standard protocol following bolus administration of intravenous contrast. CONTRAST:  177mL ISOVUE-300 IOPAMIDOL (ISOVUE-300) INJECTION 61% COMPARISON:  MR evaluation of January 03, 2015 and CT study of December 17, 2014 FINDINGS: Lower chest: Lung bases are clear.  No pericardial effusion. Hepatobiliary: Tiny hypodensity in the left hepatic lobe unchanged likely small cyst or benign hemangioma. No signs of suspicious focal lesion. Portal vein is patent. Changes of cholecystectomy are noted without biliary ductal  dilation. Pancreas: Within normal limits. Spleen: Within normal limits Adrenals/Urinary Tract: Adrenals are normal. Small cyst arises from the interpolar right kidney. No signs of hydronephrosis or suspicious renal mass. No renal calculi. Stomach/Bowel: Stomach is decompressed. Small bowel is normal on CT. Mildly dilated appendix with wall enhancement, no periappendiceal fluid and very subtle periappendiceal stranding, this represents a change when compared to the study of December 17, 2014. Appendiceal diameter approximately 9 mm. Subtle stranding is noted about the mid sigmoid colon extending into the sigmoid mesocolon. Vascular/Lymphatic: Scattered atherosclerosis. No signs of aortic aneurysm. 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 Reproductive: Post hysterectomy. Other: Urinary bladder is unremarkable. No signs of adenopathy in the pelvis. Musculoskeletal: Spinal degenerative changes without acute or destructive bone finding. IMPRESSION: 1. Dilated appendix with hyperenhancing wall may represent early acute appendicitis in the appropriate clinical setting. 2. Mild pericolonic stranding about the mid sigmoid colon could represent early diverticulitis. 3. Post cholecystectomy. 4. Atherosclerosis and other incidental findings as outlined above. 5. These results were called by telephone at the time of interpretation on 06/28/2019 at 1:51 pm to provider [facility] , who verbally acknowledged these results. Electronically Signed   By: Zetta Bills M.D.   On: 06/28/2019 13:54    Microbiology: Recent Results (from the past 240 hour(s))  SARS Coronavirus 2 [facility] order, Performed in [facility] hospital lab) Nasopharyngeal Nasopharyngeal Swab     Status: None   Collection Time: 07/01/19  5:14 PM   Specimen: Nasopharyngeal Swab  Result Value Ref Range Status   SARS Coronavirus 2 NEGATIVE NEGATIVE Final    Comment: (NOTE) If result is  NEGATIVE SARS-CoV-2 target nucleic acids are NOT DETECTED. The SARS-CoV-2 RNA is generally detectable in upper and lower  respiratory specimens during the acute phase of infection. The lowest  concentration of SARS-CoV-2 viral copies this assay can detect is 250  copies / mL. A negative result does not preclude SARS-CoV-2 infection  and should not be used as the sole basis for treatment or other  patient management decisions.  A negative result may occur with  improper specimen collection / handling, submission of specimen other  than nasopharyngeal swab, presence of viral mutation(s) within the  areas targeted by this assay, and inadequate number of viral copies  (<250 copies / mL). A negative result must be combined with clinical  observations, patient history, and epidemiological information. If result is POSITIVE SARS-CoV-2 target nucleic acids are DETECTED. The SARS-CoV-2 RNA is generally  detectable in upper and lower  respiratory specimens dur ing the acute phase of infection.  Positive  results are indicative of active infection with SARS-CoV-2.  Clinical  correlation with patient history and other diagnostic information is  necessary to determine patient infection status.  Positive results do  not rule out bacterial infection or co-infection with other viruses. If result is PRESUMPTIVE POSTIVE SARS-CoV-2 nucleic acids MAY BE PRESENT.   A presumptive positive result was obtained on the submitted specimen  and confirmed on repeat testing.  While 2019 novel coronavirus  (SARS-CoV-2) nucleic acids may be present in the submitted sample  additional confirmatory testing may be necessary for epidemiological  and / or clinical management purposes  to differentiate between  SARS-CoV-2 and other Sarbecovirus currently known to infect humans.  If clinically indicated additional testing with an alternate test  methodology [phone]) is advised. The SARS-CoV-2 RNA is generally  detectable  in upper and lower respiratory sp ecimens during the acute  phase of infection. The expected result is Negative. Fact Sheet for Patients:  StrictlyIdeas.no Fact Sheet for Healthcare Providers: BankingDealers.co.za This test is not yet approved or cleared by the [place] FDA and has been authorized for detection and/or diagnosis of SARS-CoV-2 by FDA under an Emergency Use Authorization (EUA).  This EUA will remain in effect (meaning this test can be used) for the duration of the COVID-19 declaration under Section 564(b)(1) of the Act, 21 U.S.C. section 360bbb-3(b)(1), unless the authorization is terminated or revoked sooner. Performed at [facility], [place] [address], [place], [place] [address]      Labs: Basic Metabolic Panel: Recent Labs  Lab 07/01/19 1607  NA 138  K 3.8  CL 107  CO2 23  GLUCOSE 119*  BUN 15  CREATININE 0.97  CALCIUM 9.0   Liver Function Tests: Recent Labs  Lab 07/01/19 1607  AST 19  ALT 13  ALKPHOS 46  BILITOT 0.4  PROT 6.5  ALBUMIN 3.7   Recent Labs  Lab 07/01/19 1607  LIPASE 21   No results for input(s): AMMONIA in the last 168 hours. CBC: Recent Labs  Lab 07/01/19 1607 07/02/19 0247  WBC 12.4* 10.0  HGB 12.0 10.7*  HCT 36.6 32.9*  MCV 96.1 97.1  PLT 274 215   Cardiac Enzymes: No results for input(s): CKTOTAL, CKMB, CKMBINDEX, TROPONINI in the last 168 hours. BNP: BNP (last 3 results) No results for input(s): BNP in the last 8760 hours.  ProBNP (last 3 results) No results for input(s): PROBNP in the last 8760 hours.  CBG: Recent Labs  Lab 07/01/19 2258 07/02/19 0801 07/02/19 1010 07/02/19 1314  GLUCAP 127* 124* 170* 190*       Signed:  Alma Friendly, MD [facility] 07/02/2019, 3:26 PM

## 2019-07-02 NOTE — ED Notes (Signed)
Pt given clear liquid diet.

## 2019-07-02 NOTE — ED Notes (Signed)
CBG 170 

## 2019-07-02 NOTE — Discharge Instructions (Signed)

## 2019-07-02 NOTE — ED Notes (Signed)
Lunch Tray Ordered @ 1040. 

## 2019-07-02 NOTE — ED Notes (Signed)
MS 

## 2019-07-02 NOTE — Progress Notes (Addendum)
   Subjective/Chief Complaint: Feels fine, no issues this am having bowel function tol fulls   Objective: Vital signs in last 24 hours: Temp:  [98 F (36.7 C)] 98 F (36.7 C) (09/20 1548) Pulse Rate:  [63-85] 64 (09/21 0801) Resp:  [14-16] 16 (09/21 0801) BP: (124-162)/(54-70) 136/62 (09/21 0000) SpO2:  [94 %-100 %] 97 % (09/21 0801) Weight:  [90.7 kg] 90.7 kg (09/20 1549)    Intake/Output from previous day: 09/20 0701 - 09/21 0700 In: 700 [IV Piggyback:700] Out: -  Intake/Output this shift: Total I/O In: 280 [IV Piggyback:280] Out: -   GI: soft nt/nd  Lab Results:  Recent Labs    07/01/19 1607 07/02/19 0247  WBC 12.4* 10.0  HGB 12.0 10.7*  HCT 36.6 32.9*  PLT 274 215   BMET Recent Labs    07/01/19 1607  NA 138  K 3.8  CL 107  CO2 23  GLUCOSE 119*  BUN 15  CREATININE 0.97  CALCIUM 9.0   PT/INR No results for input(s): LABPROT, INR in the last 72 hours. ABG No results for input(s): PHART, HCO3 in the last 72 hours.  Invalid input(s): PCO2, PO2  Studies/Results: No results found.  Anti-infectives: Anti-infectives (From admission, onward)   Start     Dose/Rate Route Frequency Ordered Stop   07/02/19 0800  ciprofloxacin (CIPRO) IVPB 400 mg     400 mg 200 mL/hr over 60 Minutes Intravenous Every 12 hours 07/02/19 0310     07/02/19 0400  metroNIDAZOLE (FLAGYL) IVPB 500 mg     500 mg 100 mL/hr over 60 Minutes Intravenous Every 8 hours 07/02/19 0302     07/01/19 1715  ciprofloxacin (CIPRO) IVPB 400 mg     400 mg 200 mL/hr over 60 Minutes Intravenous  Once 07/01/19 1703 07/01/19 2042   07/01/19 1715  metroNIDAZOLE (FLAGYL) IVPB 500 mg     500 mg 100 mL/hr over 60 Minutes Intravenous  Once 07/01/19 1703 07/01/19 1939      Assessment/Plan: Diverticulitis -has really clinically resolved, wbc normal, no tenderness -I advanced to soft diet -can likely send home later today if doing well on course of augmentin for 10 days and follow up with Dr  Collene Mares -agree with Dr Rosendo Gros I do not think she has appendicitis Rolm Bookbinder 07/02/2019

## 2019-07-04 ENCOUNTER — Other Ambulatory Visit: Payer: PPO

## 2019-07-10 DIAGNOSIS — R197 Diarrhea, unspecified: Secondary | ICD-10-CM | POA: Diagnosis not present

## 2019-07-10 DIAGNOSIS — K5730 Diverticulosis of large intestine without perforation or abscess without bleeding: Secondary | ICD-10-CM | POA: Diagnosis not present

## 2019-07-31 DIAGNOSIS — Z1231 Encounter for screening mammogram for malignant neoplasm of breast: Secondary | ICD-10-CM | POA: Diagnosis not present

## 2019-08-01 DIAGNOSIS — F3341 Major depressive disorder, recurrent, in partial remission: Secondary | ICD-10-CM | POA: Diagnosis not present

## 2019-08-01 DIAGNOSIS — E2839 Other primary ovarian failure: Secondary | ICD-10-CM | POA: Diagnosis not present

## 2019-08-01 DIAGNOSIS — E782 Mixed hyperlipidemia: Secondary | ICD-10-CM | POA: Diagnosis not present

## 2019-08-01 DIAGNOSIS — E039 Hypothyroidism, unspecified: Secondary | ICD-10-CM | POA: Diagnosis not present

## 2019-08-01 DIAGNOSIS — Z23 Encounter for immunization: Secondary | ICD-10-CM | POA: Diagnosis not present

## 2019-08-01 DIAGNOSIS — E119 Type 2 diabetes mellitus without complications: Secondary | ICD-10-CM | POA: Diagnosis not present

## 2019-08-01 DIAGNOSIS — I10 Essential (primary) hypertension: Secondary | ICD-10-CM | POA: Diagnosis not present

## 2019-08-01 DIAGNOSIS — F419 Anxiety disorder, unspecified: Secondary | ICD-10-CM | POA: Diagnosis not present

## 2019-08-08 DIAGNOSIS — E2839 Other primary ovarian failure: Secondary | ICD-10-CM | POA: Diagnosis not present

## 2019-08-08 DIAGNOSIS — Z90710 Acquired absence of both cervix and uterus: Secondary | ICD-10-CM | POA: Diagnosis not present

## 2019-08-08 DIAGNOSIS — Z780 Asymptomatic menopausal state: Secondary | ICD-10-CM | POA: Diagnosis not present

## 2019-08-08 DIAGNOSIS — E119 Type 2 diabetes mellitus without complications: Secondary | ICD-10-CM | POA: Diagnosis not present

## 2019-08-08 DIAGNOSIS — R29890 Loss of height: Secondary | ICD-10-CM | POA: Diagnosis not present

## 2019-08-16 DIAGNOSIS — M5136 Other intervertebral disc degeneration, lumbar region: Secondary | ICD-10-CM | POA: Diagnosis not present

## 2019-08-16 DIAGNOSIS — M5416 Radiculopathy, lumbar region: Secondary | ICD-10-CM | POA: Diagnosis not present

## 2019-08-27 DIAGNOSIS — E119 Type 2 diabetes mellitus without complications: Secondary | ICD-10-CM | POA: Diagnosis not present

## 2019-08-27 DIAGNOSIS — H02834 Dermatochalasis of left upper eyelid: Secondary | ICD-10-CM | POA: Diagnosis not present

## 2019-08-27 DIAGNOSIS — H2513 Age-related nuclear cataract, bilateral: Secondary | ICD-10-CM | POA: Diagnosis not present

## 2019-08-27 DIAGNOSIS — H43811 Vitreous degeneration, right eye: Secondary | ICD-10-CM | POA: Diagnosis not present

## 2019-08-27 DIAGNOSIS — H02831 Dermatochalasis of right upper eyelid: Secondary | ICD-10-CM | POA: Diagnosis not present

## 2019-09-04 DIAGNOSIS — M5136 Other intervertebral disc degeneration, lumbar region: Secondary | ICD-10-CM | POA: Diagnosis not present

## 2019-09-10 DIAGNOSIS — H02834 Dermatochalasis of left upper eyelid: Secondary | ICD-10-CM | POA: Diagnosis not present

## 2019-09-10 DIAGNOSIS — H52223 Regular astigmatism, bilateral: Secondary | ICD-10-CM | POA: Diagnosis not present

## 2019-09-10 DIAGNOSIS — H02831 Dermatochalasis of right upper eyelid: Secondary | ICD-10-CM | POA: Diagnosis not present

## 2019-09-10 DIAGNOSIS — H524 Presbyopia: Secondary | ICD-10-CM | POA: Diagnosis not present

## 2019-09-10 DIAGNOSIS — H43811 Vitreous degeneration, right eye: Secondary | ICD-10-CM | POA: Diagnosis not present

## 2019-09-10 DIAGNOSIS — H5213 Myopia, bilateral: Secondary | ICD-10-CM | POA: Diagnosis not present

## 2019-09-10 DIAGNOSIS — H2513 Age-related nuclear cataract, bilateral: Secondary | ICD-10-CM | POA: Diagnosis not present

## 2019-09-10 DIAGNOSIS — E119 Type 2 diabetes mellitus without complications: Secondary | ICD-10-CM | POA: Diagnosis not present

## 2020-02-11 DIAGNOSIS — Z794 Long term (current) use of insulin: Secondary | ICD-10-CM | POA: Diagnosis not present

## 2020-02-11 DIAGNOSIS — I10 Essential (primary) hypertension: Secondary | ICD-10-CM | POA: Diagnosis not present

## 2020-02-11 DIAGNOSIS — E782 Mixed hyperlipidemia: Secondary | ICD-10-CM | POA: Diagnosis not present

## 2020-02-11 DIAGNOSIS — F17210 Nicotine dependence, cigarettes, uncomplicated: Secondary | ICD-10-CM | POA: Diagnosis not present

## 2020-02-11 DIAGNOSIS — F3341 Major depressive disorder, recurrent, in partial remission: Secondary | ICD-10-CM | POA: Diagnosis not present

## 2020-02-11 DIAGNOSIS — R002 Palpitations: Secondary | ICD-10-CM | POA: Diagnosis not present

## 2020-02-11 DIAGNOSIS — F419 Anxiety disorder, unspecified: Secondary | ICD-10-CM | POA: Diagnosis not present

## 2020-02-11 DIAGNOSIS — R001 Bradycardia, unspecified: Secondary | ICD-10-CM | POA: Diagnosis not present

## 2020-02-11 DIAGNOSIS — E039 Hypothyroidism, unspecified: Secondary | ICD-10-CM | POA: Diagnosis not present

## 2020-02-11 DIAGNOSIS — R5383 Other fatigue: Secondary | ICD-10-CM | POA: Diagnosis not present

## 2020-02-11 DIAGNOSIS — E119 Type 2 diabetes mellitus without complications: Secondary | ICD-10-CM | POA: Diagnosis not present

## 2020-02-15 DIAGNOSIS — M791 Myalgia, unspecified site: Secondary | ICD-10-CM | POA: Diagnosis not present

## 2020-02-15 DIAGNOSIS — R252 Cramp and spasm: Secondary | ICD-10-CM | POA: Diagnosis not present

## 2020-03-03 DIAGNOSIS — N289 Disorder of kidney and ureter, unspecified: Secondary | ICD-10-CM | POA: Diagnosis not present

## 2020-05-13 DIAGNOSIS — E559 Vitamin D deficiency, unspecified: Secondary | ICD-10-CM | POA: Diagnosis not present

## 2020-05-13 DIAGNOSIS — E782 Mixed hyperlipidemia: Secondary | ICD-10-CM | POA: Diagnosis not present

## 2020-05-13 DIAGNOSIS — E119 Type 2 diabetes mellitus without complications: Secondary | ICD-10-CM | POA: Diagnosis not present

## 2020-05-13 DIAGNOSIS — I10 Essential (primary) hypertension: Secondary | ICD-10-CM | POA: Diagnosis not present

## 2020-07-11 DIAGNOSIS — E119 Type 2 diabetes mellitus without complications: Secondary | ICD-10-CM | POA: Diagnosis not present

## 2020-07-11 DIAGNOSIS — Z23 Encounter for immunization: Secondary | ICD-10-CM | POA: Diagnosis not present

## 2020-07-29 DIAGNOSIS — M25562 Pain in left knee: Secondary | ICD-10-CM | POA: Diagnosis not present

## 2020-08-05 DIAGNOSIS — Z1231 Encounter for screening mammogram for malignant neoplasm of breast: Secondary | ICD-10-CM | POA: Diagnosis not present

## 2020-08-05 LAB — HM MAMMOGRAPHY

## 2020-08-13 DIAGNOSIS — E039 Hypothyroidism, unspecified: Secondary | ICD-10-CM | POA: Diagnosis not present

## 2020-08-13 DIAGNOSIS — E782 Mixed hyperlipidemia: Secondary | ICD-10-CM | POA: Diagnosis not present

## 2020-08-13 DIAGNOSIS — Z1159 Encounter for screening for other viral diseases: Secondary | ICD-10-CM | POA: Diagnosis not present

## 2020-08-13 DIAGNOSIS — M2041 Other hammer toe(s) (acquired), right foot: Secondary | ICD-10-CM | POA: Diagnosis not present

## 2020-08-13 DIAGNOSIS — F3341 Major depressive disorder, recurrent, in partial remission: Secondary | ICD-10-CM | POA: Diagnosis not present

## 2020-08-13 DIAGNOSIS — F419 Anxiety disorder, unspecified: Secondary | ICD-10-CM | POA: Diagnosis not present

## 2020-08-13 DIAGNOSIS — Z0000 Encounter for general adult medical examination without abnormal findings: Secondary | ICD-10-CM | POA: Diagnosis not present

## 2020-08-13 DIAGNOSIS — I10 Essential (primary) hypertension: Secondary | ICD-10-CM | POA: Diagnosis not present

## 2020-08-13 DIAGNOSIS — E119 Type 2 diabetes mellitus without complications: Secondary | ICD-10-CM | POA: Diagnosis not present

## 2020-08-13 DIAGNOSIS — F17200 Nicotine dependence, unspecified, uncomplicated: Secondary | ICD-10-CM | POA: Diagnosis not present

## 2020-08-13 DIAGNOSIS — Z23 Encounter for immunization: Secondary | ICD-10-CM | POA: Diagnosis not present

## 2020-08-18 DIAGNOSIS — E119 Type 2 diabetes mellitus without complications: Secondary | ICD-10-CM | POA: Diagnosis not present

## 2020-08-18 DIAGNOSIS — E782 Mixed hyperlipidemia: Secondary | ICD-10-CM | POA: Diagnosis not present

## 2020-08-18 DIAGNOSIS — I10 Essential (primary) hypertension: Secondary | ICD-10-CM | POA: Diagnosis not present

## 2020-08-18 DIAGNOSIS — Z1159 Encounter for screening for other viral diseases: Secondary | ICD-10-CM | POA: Diagnosis not present

## 2020-08-18 DIAGNOSIS — E039 Hypothyroidism, unspecified: Secondary | ICD-10-CM | POA: Diagnosis not present

## 2020-11-29 ENCOUNTER — Emergency Department (INDEPENDENT_AMBULATORY_CARE_PROVIDER_SITE_OTHER): Payer: PPO

## 2020-11-29 ENCOUNTER — Encounter: Payer: Self-pay | Admitting: Emergency Medicine

## 2020-11-29 ENCOUNTER — Emergency Department
Admission: EM | Admit: 2020-11-29 | Discharge: 2020-11-29 | Disposition: A | Discharge status: Home / Self Care | Payer: PPO | Source: Home / Self Care

## 2020-11-29 ENCOUNTER — Other Ambulatory Visit: Payer: Self-pay

## 2020-11-29 DIAGNOSIS — M7989 Other specified soft tissue disorders: Secondary | ICD-10-CM

## 2020-11-29 DIAGNOSIS — R002 Palpitations: Secondary | ICD-10-CM

## 2020-11-29 DIAGNOSIS — R0602 Shortness of breath: Secondary | ICD-10-CM | POA: Diagnosis not present

## 2020-11-29 HISTORY — DX: Diverticulitis of intestine, part unspecified, without perforation or abscess without bleeding: K57.92

## 2020-11-29 LAB — POCT URINALYSIS DIP (MANUAL ENTRY)
Bilirubin, UA: NEGATIVE
Blood, UA: NEGATIVE
Glucose, UA: NEGATIVE mg/dL
Ketones, POC UA: NEGATIVE mg/dL
Leukocytes, UA: NEGATIVE
Nitrite, UA: NEGATIVE
Protein Ur, POC: NEGATIVE mg/dL
Spec Grav, UA: 1.015 (ref 1.010–1.025)
Urobilinogen, UA: 0.2 E.U./dL
pH, UA: 6 (ref 5.0–8.0)

## 2020-11-29 LAB — POCT FASTING CBG KUC MANUAL ENTRY: POCT Glucose (KUC): 142 mg/dL — AB (ref 70–99)

## 2020-11-29 MED ORDER — FUROSEMIDE 40 MG PO TABS: 40.0000 mg | ORAL_TABLET | Freq: Every day | ORAL | 0 refills | Status: DC

## 2020-11-29 NOTE — ED Triage Notes (Signed)
Leg swelling increased over the last 2 days  Called her PCP yesterday who told her to go to an Urgent Care or ED Pt here today  Pt states my BP has been high  Cut back on my medicine earlier this week, took full doses the last 3 days  BP runs 130/70 CBG this am 105

## 2020-11-29 NOTE — Discharge Instructions (Addendum)
If any further shortness of breath or palpitations and or chest pain develop go immediately to the closest emergency department for further work-up and evaluation.  Put you on furosemide to improve your leg swelling.  Continue your blood pressure medication as prescribed.  Please schedule a new patient appointment at either office listed for next available appointment.

## 2020-11-29 NOTE — ED Provider Notes (Signed)
[facility]    CSN: [id number] Arrival date & time: 11/29/20  1309      History   Chief Complaint Chief Complaint  Patient presents with   Leg Swelling    HPI Patricia Mills is a 75 y.o. female.   HPI  Patient presents today with acute onset leg swelling.  Patient reports noticing over the last 2 days worsening of lower leg swelling.  She denies any changes in diet.  However notes that she had cut herself back on taking her blood pressure medication and was only taking half of the prescribed dose.  Since leg swelling has occurred over the last few days she has resumed taking the correct dosage of her blood pressure medication.  She is prescribed losartan 50 mg once daily.  She denies any acute chest pain or dizziness.  She was concerned also as her blood pressures have been running high at home. BP here today is 160/80.  She denies any headache or visual changes.  She endorses palpitations and has a history of hypothyroidism and endorses compliance with her medication.  She also has a history of diabetes and reports her blood sugars have been less than 150 and is well controlled typically.  She also endorses some intermittent shortness of breath however she is a current daily smoker.  She is established with [facility] in [place].  She reports due to changes in her insurance she has not scheduled a follow-up with her primary care provider.   Past Medical History:  Diagnosis Date   Diabetes mellitus without complication ([place])    Diverticulitis    Hypertension    Thyroid disease     Patient Active Problem List   Diagnosis Date Noted   Diverticulitis 07/01/2019   Syncope 10/06/2018   Essential hypertension 10/06/2018   Hyperlipidemia 10/06/2018   Type 2 diabetes mellitus with complication, without long-term current use of insulin ([place]) 10/06/2018   Degeneration of lumbar intervertebral disc 10/18/2017   Hypothyroidism  11/28/2015   Recurrent major depressive disorder, in partial remission ([place]) 11/28/2015    Past Surgical History:  Procedure Laterality Date   ABDOMINAL HYSTERECTOMY     CHOLECYSTECTOMY     TONSILLECTOMY      OB History   No obstetric history on file.      Home Medications    Prior to Admission medications   Medication Sig Start Date End Date Taking? Authorizing Provider  albuterol (VENTOLIN HFA) 108 (90 Base) MCG/ACT inhaler Inhale 2 puffs into the lungs every 6 (six) hours as needed for wheezing.   Yes [provider]  aspirin 81 MG chewable tablet Chew 81 mg by mouth at bedtime.    Yes [provider]  calcium carbonate (OS-CAL) 600 MG TABS tablet Take 600 mg by mouth daily.   Yes [provider]  Cholecalciferol (VITAMIN D-1000 MAX ST) 25 MCG (1000 UT) tablet Take 1,000 Units by mouth daily.   Yes [provider]  insulin glargine (LANTUS) 100 UNIT/ML Solostar Pen Inject 10 Units into the skin daily.  01/03/18  Yes [provider]  loratadine (CLARITIN) 10 MG tablet Take 10 mg by mouth daily.   Yes [provider]  losartan (COZAAR) 50 MG tablet Take 50 mg by mouth at bedtime.    Yes [provider]  Omega-3 1000 MG CAPS Take 1 g by mouth daily.   Yes [provider]  sertraline (ZOLOFT) 50 MG tablet Take 50 mg  by mouth at bedtime.    Yes [provider]  acetaminophen (TYLENOL) 500 MG tablet Take 500 mg by mouth every 6 (six) hours as needed for moderate pain.    [provider]  ALPRAZolam Duanne Moron) 0.5 MG tablet Take 0.25 mg by mouth as needed for anxiety. 01/03/18   [provider]  ciprofloxacin (CIPRO) 500 MG tablet Take 500 mg by mouth every 12 (twelve) hours. Patient not taking: Reported on 11/29/2020 06/28/19   [provider]  glipiZIDE (GLUCOTROL XL) 5 MG 24 hr tablet Take 5 mg by mouth at bedtime. 05/22/19   [provider]  Lancets (ONETOUCH DELICA PLUS  [id number]) [place] 4 (four) times daily. 04/12/19   [provider]  metroNIDAZOLE (FLAGYL) 250 MG tablet Take 250 mg by mouth 3 (three) times daily. Patient not taking: Reported on 11/29/2020 06/29/19   [provider]  [facility] ULTRA test strip 2 (two) times daily. 04/12/19   [provider]    Family History Family History  Problem Relation Age of Onset   Stroke Mother     Social History Social History   Tobacco Use   Smoking status: Current Every Day Smoker    Packs/day: 0.50    Years: 20.00    Pack years: 10.00    Types: Cigarettes   Smokeless tobacco: Never Used  Substance Use Topics   Alcohol use: No   Drug use: No     Allergies   Metformin and Penicillins   Review of Systems Review of Systems Pertinent negatives listed in HPI  Physical Exam Triage Vital Signs ED Triage Vitals  Enc Vitals Group     BP 11/29/20 1410 (!) 160/80     Pulse Rate 11/29/20 1410 65     Resp 11/29/20 1410 16     Temp 11/29/20 1410 98.8 F (37.1 C)     Temp Source 11/29/20 1410 Oral     SpO2 11/29/20 1410 96 %     Weight 11/29/20 1417 205 lb (93 kg)     Height 11/29/20 1417 5' 8.5" (1.74 m)     Head Circumference --      Peak Flow --      Pain Score --      Pain Loc --      Pain Edu? --      Excl. in [place]? --    No data found.  Updated Vital Signs BP (!) 160/80 (BP Location: Right Arm)    Pulse 65    Temp 98.8 F (37.1 C) (Oral)    Resp 16    Ht 5' 8.5" (1.74 m)    Wt 205 lb (93 kg)    SpO2 96%    BMI 30.72 kg/m   Visual Acuity Right Eye Distance:   Left Eye Distance:   Bilateral Distance:    Right Eye Near:   Left Eye Near:    Bilateral Near:     Physical Exam Constitutional: Patient appears alert, overweight, cooperative without acute distress. HENT: Normocephalic, atraumatic, External right and left ear normal. Oropharynx is clear and moist.  Eyes: Conjunctivae and EOM are normal. PERRLA, no scleral icterus. Neck: Normal ROM. Neck  supple. No JVD. No tracheal deviation. No thyromegaly. CVS: RRR, S1/S2 +, no murmurs, no gallops, no carotid bruit. +3 non pitting edema BLE Pulmonary: Effort and breath sounds normal, no stridor, rhonchi, wheezes, rales.  Abdominal: Soft. BS +, no distension, tenderness, rebound or guarding.  Musculoskeletal: Normal range of motion. No edema  and no tenderness.  Neuro: Alert. Normal reflexes, muscle tone coordination. No cranial nerve deficit. Skin: Skin is warm and dry. No rash noted. Not diaphoretic. No erythema. No pallor. Psychiatric: Normal mood and affect. Behavior, judgment, thought content normal.   UC Treatments / Results  Labs (all labs ordered are listed, but only abnormal results are displayed) Labs Reviewed  POCT FASTING CBG [facility] MANUAL ENTRY - Abnormal; Notable for the following components:      Result Value   POCT Glucose ([facility]) 142 (*)    All other components within normal limits  POCT URINALYSIS DIP (MANUAL ENTRY)    EKG Interpretation Normal sinus rhythm, BPM 60, no ST changes noted   Radiology DG Chest 2 View  Result Date: 11/29/2020 CLINICAL DATA:  Shortness of breath.  Bilateral leg swelling. EXAM: CHEST - 2 VIEW COMPARISON:  None. FINDINGS: The heart size and mediastinal contours are within normal limits. Both lungs are clear. The visualized skeletal structures are unremarkable. IMPRESSION: No active cardiopulmonary disease. Electronically Signed   By: Dorise Bullion III M.D   On: 11/29/2020 15:07    Procedures Procedures (including critical care time)  Medications Ordered in UC Medications - No data to display  Initial Impression / Assessment and Plan / UC Course  I have reviewed the triage vital signs and the nursing notes.  Pertinent labs & imaging results that were available during my care of the patient were reviewed by me and considered in my medical decision making (see chart for details).     Patient presents today with acute onset leg swelling  in the presence of chronic conditions such as type 2 diabetes, hypertension, and hyperlipidemia.  Blood pressure is mildly elevated today.  However patient has not been compliant with blood pressure medication.  Chest x-ray unremarkable.  Blood sugar 142-hour patient is not fasting.  UA normal. Will treat swelling in lower legs with furosemide 40 mg daily for the next 5 days.  Will have patient schedule a follow-up with her primary care provider within the next 7 days that she will need labs to ensure that potassium level and renal function are within normal parameters.  Was able to review patient's most recent blood work creatinine and BUN were stable with a GFR greater than 50.. Strict follow-up precautions given to patient to return if symptoms worsen or do not improve.  Patient verbalized understanding and agreement with plan. Final Clinical Impressions(s) / UC Diagnoses   Final diagnoses:  Leg swelling  Shortness of breath  Palpitations     Discharge Instructions     If any further shortness of breath or palpitations and or chest pain develop go immediately to the closest emergency department for further work-up and evaluation.  Put you on furosemide to improve your leg swelling.  Continue your blood pressure medication as prescribed.  Please schedule a new patient appointment at either office listed for next available appointment.    ED Prescriptions    Medication Sig Dispense Auth. Provider   furosemide (LASIX) 40 MG tablet Take 1 tablet (40 mg total) by mouth daily. 5 tablet Scot Jun, FNP     PDMP not reviewed this encounter.   Scot Jun, FNP 12/03/20 1319

## 2020-12-02 DIAGNOSIS — M7989 Other specified soft tissue disorders: Secondary | ICD-10-CM | POA: Diagnosis not present

## 2020-12-02 DIAGNOSIS — R202 Paresthesia of skin: Secondary | ICD-10-CM | POA: Diagnosis not present

## 2020-12-02 DIAGNOSIS — R0602 Shortness of breath: Secondary | ICD-10-CM | POA: Diagnosis not present

## 2020-12-16 ENCOUNTER — Ambulatory Visit (INDEPENDENT_AMBULATORY_CARE_PROVIDER_SITE_OTHER): Payer: PPO | Admitting: Medical-Surgical

## 2020-12-16 ENCOUNTER — Other Ambulatory Visit: Payer: Self-pay

## 2020-12-16 ENCOUNTER — Encounter: Payer: Self-pay | Admitting: Medical-Surgical

## 2020-12-16 VITALS — BP 123/73 | HR 64 | Temp 98.1°F | Ht 67.5 in | Wt 204.2 lb

## 2020-12-16 DIAGNOSIS — E118 Type 2 diabetes mellitus with unspecified complications: Secondary | ICD-10-CM | POA: Diagnosis not present

## 2020-12-16 DIAGNOSIS — Z7689 Persons encountering health services in other specified circumstances: Secondary | ICD-10-CM

## 2020-12-16 DIAGNOSIS — F3341 Major depressive disorder, recurrent, in partial remission: Secondary | ICD-10-CM | POA: Diagnosis not present

## 2020-12-16 DIAGNOSIS — J4520 Mild intermittent asthma, uncomplicated: Secondary | ICD-10-CM

## 2020-12-16 DIAGNOSIS — R197 Diarrhea, unspecified: Secondary | ICD-10-CM | POA: Diagnosis not present

## 2020-12-16 DIAGNOSIS — I10 Essential (primary) hypertension: Secondary | ICD-10-CM | POA: Diagnosis not present

## 2020-12-16 DIAGNOSIS — E039 Hypothyroidism, unspecified: Secondary | ICD-10-CM

## 2020-12-16 DIAGNOSIS — E782 Mixed hyperlipidemia: Secondary | ICD-10-CM

## 2020-12-16 DIAGNOSIS — J45909 Unspecified asthma, uncomplicated: Secondary | ICD-10-CM | POA: Insufficient documentation

## 2020-12-16 MED ORDER — DICYCLOMINE HCL 10 MG PO CAPS: 10.0000 mg | ORAL_CAPSULE | Freq: Four times a day (QID) | ORAL | 1 refills | Status: DC | PRN

## 2020-12-16 MED ORDER — ALBUTEROL SULFATE HFA 108 (90 BASE) MCG/ACT IN AERS: 2.0000 | INHALATION_SPRAY | Freq: Four times a day (QID) | RESPIRATORY_TRACT | 6 refills | Status: AC | PRN

## 2020-12-16 NOTE — Progress Notes (Signed)
New Patient Office Visit  Subjective:  Patient ID: Patricia Mills, female    DOB: 1946-01-24  Age: 75 y.o. MRN: [id number]  CC:  Chief Complaint  Patient presents with  . Establish Care    HPI Patricia Mills presents to establish care.   Diabetes-diagnosed in 2010 as type II diabetic.  Taking glipizide XL 5 mg daily and Lantus 15 units nightly.  She did not tolerate Metformin due to GI issues and notes that she does have a sensitive GI tract.  Still has some GI effects from glipizide but these are manageable.  Checking her blood sugars daily with a reading of 116 this morning.  Last hemoglobin A1c in November 2021 was 7.0%.  Hypertension-taking losartan 50 mg daily, tolerating well without side effects. Denies CP, SOB, palpitations, lower extremity edema, dizziness, headaches, or vision changes.  Anxiety/depression-taking Zoloft 50 mg daily, tolerating well has been on this for years.  She does take Xanax, most often a half of a tablet 1-2 times weekly for severe anxiety.  She has been on this treatment for many years and it works well for her.  History of asthmatic bronchitis-uses an albuterol inhaler as needed.  Used to have these flares on a yearly basis but seems to be going longer in between flares now.  GI concerns-has had an increase in diarrhea and recent years.  Notes that her diarrhea started when she was originally placed on Metformin 1 g twice daily.  She has since stopped the medication but does still have difficulty with diarrhea.  A previous GI provider recommended she have an endoscopy but when she saw the provider at the endoscopy center, they said there was no need so this was not done.  She does take Imodium approximately 4 doses per day every day but this is only moderately helpful.  No abdominal pain, nausea, or vomiting.  Past Medical History:  Diagnosis Date  . Diabetes mellitus without complication ([place])   . Diverticulitis   . Hypertension   . Thyroid disease      Past Surgical History:  Procedure Laterality Date  . ABDOMINAL HYSTERECTOMY    . CHOLECYSTECTOMY    . TONSILLECTOMY     Family History  Problem Relation Age of Onset  . Stroke Mother    Social History   Socioeconomic History  . Marital status: Divorced    Spouse name: Not on file  . Number of children: Not on file  . Years of education: Not on file  . Highest education level: Not on file  Occupational History  . Not on file  Tobacco Use  . Smoking status: Current Every Day Smoker    Packs/day: 0.25    Years: 20.00    Pack years: 5.00    Types: Cigarettes  . Smokeless tobacco: Never Used  Substance and Sexual Activity  . Alcohol use: Not Currently  . Drug use: Never  . Sexual activity: Not Currently  Other Topics Concern  . Not on file  Social History Narrative  . Not on file   Social Determinants of Health   Financial Resource Strain: Not on file  Food Insecurity: Not on file  Transportation Needs: Not on file  Physical Activity: Not on file  Stress: Not on file  Social Connections: Not on file  Intimate Partner Violence: Not on file    ROS Review of Systems  Constitutional: Negative for chills, fatigue, fever and unexpected weight change.  Respiratory: Negative for cough, chest tightness, shortness of  breath and wheezing.   Cardiovascular: Negative for chest pain, palpitations and leg swelling.  Gastrointestinal: Positive for abdominal pain and diarrhea. Negative for constipation, nausea and vomiting.  Endocrine: Positive for cold intolerance. Negative for heat intolerance.  Neurological: Negative for dizziness, light-headedness and headaches.  Psychiatric/Behavioral: Negative for dysphoric mood, self-injury, sleep disturbance and suicidal ideas. The patient is nervous/anxious.     Objective:   Today's Vitals: BP 123/73   Pulse 64   Temp 98.1 F (36.7 C)   Ht 5' 7.5" (1.715 m)   Wt 204 lb 3.2 oz (92.6 kg)   SpO2 96%   BMI 31.51 kg/m    Physical Exam Vitals reviewed.  Constitutional:      General: She is not in acute distress.    Appearance: Normal appearance.  HENT:     Head: Normocephalic and atraumatic.  Cardiovascular:     Rate and Rhythm: Normal rate and regular rhythm.     Pulses: Normal pulses.     Heart sounds: Normal heart sounds. No murmur heard. No friction rub. No gallop.   Pulmonary:     Effort: Pulmonary effort is normal. No respiratory distress.     Breath sounds: Normal breath sounds. No wheezing.  Abdominal:     General: Bowel sounds are normal. There is no distension.     Palpations: Abdomen is soft.     Tenderness: There is no abdominal tenderness.  Skin:    General: Skin is warm and dry.  Neurological:     Mental Status: She is alert and oriented to person, place, and time.  Psychiatric:        Mood and Affect: Mood normal.        Behavior: Behavior normal.        Thought Content: Thought content normal.        Judgment: Judgment normal.     Assessment & Plan:   1. Encounter to establish care Reviewed available information and discussed healthcare concerns with patient.  2. Type 2 diabetes mellitus with complication, without long-term current use of insulin (HCC) Continue Lantus and glipizide as ordered.  Monitor dietary intake of sugars and simple carbohydrates.    3. Essential hypertension Continue losartan 50 mg daily.  4. Mixed hyperlipidemia Previously prescribed pravastatin but not taking this.  Per diabetic recommendations, should be on a statin medication.  We will check lipids in the upcoming couple of months to see what her status is and revisit taking a statin medication.  5. Hypothyroidism, unspecified type Last TSH 2.166 on 12/02/2020 without medications.  Stable for now but we will recheck this on her return visit.  6. Recurrent major depressive disorder, in partial remission (HCC)/anxiety Continue Zoloft 50 mg daily.  Continue Xanax but limit dosing as this is a  contraindicated medicine in the older population.  Use as sparingly as possible for severe anxiety.  7.  Asthmatic bronchitis Refilling albuterol inhaler today.  8.  Diarrhea Since Imodium is not managing symptoms well, we will try a course of dicyclomine 10 mg 4 times daily as needed to see if this is more effective and better tolerated.  Outpatient Encounter Medications as of 12/16/2020  Medication Sig  . acetaminophen (TYLENOL) 500 MG tablet Take 500 mg by mouth every 6 (six) hours as needed for moderate pain.  Marland Kitchen ALPRAZolam (XANAX) 0.5 MG tablet Take 0.5 mg by mouth 3 (three) times daily as needed for anxiety.  Marland Kitchen aspirin 81 MG chewable tablet Chew 81 mg by mouth daily.  Marland Kitchen  calcium carbonate (OS-CAL) 600 MG TABS tablet Take 600 mg by mouth daily.  Marland Kitchen dicyclomine (BENTYL) 10 MG capsule Take 1 capsule (10 mg total) by mouth 4 (four) times daily as needed for spasms.  . ergocalciferol (VITAMIN D2) 1.25 MG (50000 UT) capsule Take 50,000 Units by mouth once a week.  . furosemide (LASIX) 40 MG tablet Take 1 tablet (40 mg total) by mouth daily. (Patient taking differently: Take 40 mg by mouth daily as needed.)  . glipiZIDE (GLUCOTROL XL) 5 MG 24 hr tablet Take 5 mg by mouth daily.  . insulin glargine (LANTUS) 100 UNIT/ML Solostar Pen Inject 15 Units into the skin at bedtime.  . Lancets (ONETOUCH DELICA PLUS [id number]) MISC 4 (four) times daily.  Marland Kitchen loperamide (IMODIUM) 2 MG capsule Take 2 mg by mouth daily as needed for diarrhea or loose stools.  Marland Kitchen loratadine (CLARITIN) 10 MG tablet Take 10 mg by mouth daily as needed.  Marland Kitchen losartan (COZAAR) 50 MG tablet Take 50 mg by mouth daily.  Glory Rosebush ULTRA test strip 2 (two) times daily.  . sertraline (ZOLOFT) 50 MG tablet Take 50 mg by mouth daily.  . [DISCONTINUED] albuterol (VENTOLIN HFA) 108 (90 Base) MCG/ACT inhaler Inhale 2 puffs into the lungs every 6 (six) hours as needed for wheezing.  Marland Kitchen albuterol (VENTOLIN HFA) 108 (90 Base) MCG/ACT inhaler Inhale 2  puffs into the lungs every 6 (six) hours as needed for wheezing.  . [DISCONTINUED] Cholecalciferol (VITAMIN D-1000 MAX ST) 25 MCG (1000 UT) tablet Take 1,000 Units by mouth daily.  . [DISCONTINUED] ciprofloxacin (CIPRO) 500 MG tablet Take 500 mg by mouth every 12 (twelve) hours. (Patient not taking: Reported on 11/29/2020)  . [DISCONTINUED] metroNIDAZOLE (FLAGYL) 250 MG tablet Take 250 mg by mouth 3 (three) times daily. (Patient not taking: Reported on 11/29/2020)  . [DISCONTINUED] Omega-3 1000 MG CAPS Take 1 g by mouth daily.   No facility-administered encounter medications on file as of 12/16/2020.    Follow-up: Return in about 2 months (around 02/15/2021) for DM/HTN/HLD follow up.   Clearnce Sorrel, DNP, APRN, FNP-BC [facility]

## 2020-12-16 NOTE — Patient Instructions (Signed)
Critical care medicine: Principles of diagnosis and management in the adult (4th ed., pp. 1251-1270). Saunders."> Miller's anesthesia (8th ed., pp. 232-250). Saunders.">  Advance Directive  Advance directives are legal documents that allow you to make decisions about your health care and medical treatment in case you become unable to communicate for yourself. Advance directives let your wishes be known to family, friends, and health care providers. Discussing and writing advance directives should happen over time rather than all at once. Advance directives can be changed and updated at any time. There are different types of advance directives, such as:  Medical power of attorney.  Living will.  Do not resuscitate (DNR) order or do not attempt resuscitation (DNAR) order. Health care proxy and medical power of attorney A health care proxy is also called a health care agent. This person is appointed to make medical decisions for you when you are unable to make decisions for yourself. Generally, people ask a trusted friend or family member to act as their proxy and represent their preferences. Make sure you have an agreement with your trusted person to act as your proxy. A proxy may have to make a medical decision on your behalf if your wishes are not known. A medical power of attorney, also called a durable power of attorney for health care, is a legal document that names your health care proxy. Depending on the laws in your state, the document may need to be:  Signed.  Notarized.  Dated.  Copied.  Witnessed.  Incorporated into your medical record. You may also want to appoint a trusted person to manage your money in the event you are unable to do so. This is called a durable power of attorney for finances. It is a separate legal document from the durable power of attorney for health care. You may choose your health care proxy or someone different to act as your agent in money matters. If you  do not appoint a proxy, or there is a concern that the proxy is not acting in your best interest, a court may appoint a guardian to act on your behalf. Living will A living will is a set of instructions that state your wishes about medical care when you cannot express them yourself. Health care providers should keep a copy of your living will in your medical record. You may want to give a copy to family members or friends. To alert caregivers in case of an emergency, you can place a card in your wallet to let them know that you have a living will and where they can find it. A living will is used if you become:  Terminally ill.  Disabled.  Unable to communicate or make decisions. The following decisions should be included in your living will:  To use or not to use life support equipment, such as dialysis machines and breathing machines (ventilators).  Whether you want a DNR or DNAR order. This tells health care providers not to use cardiopulmonary resuscitation (CPR) if breathing or heartbeat stops.  To use or not to use tube feeding.  To be given or not to be given food and fluids.  Whether you want comfort (palliative) care when the goal becomes comfort rather than a cure.  Whether you want to donate your organs and tissues. A living will does not give instructions for distributing your money and property if you should pass away. DNR or DNAR A DNR or DNAR order is a request not to have CPR in   the event that your heart stops beating or you stop breathing. If a DNR or DNAR order has not been made and shared, a health care provider will try to help any patient whose heart has stopped or who has stopped breathing. If you plan to have surgery, talk with your health care provider about how your DNR or DNAR order will be followed if problems occur. What if I do not have an advance directive? Some states assign family decision makers to act on your behalf if you do not have an advance directive.  Each state has its own laws about advance directives. You may want to check with your health care provider, attorney, or state representative about the laws in your state. Summary  Advance directives are legal documents that allow you to make decisions about your health care and medical treatment in case you become unable to communicate for yourself.  The process of discussing and writing advance directives should happen over time. You can change and update advance directives at any time.  Advance directives may include a medical power of attorney, a living will, and a DNR or DNAR order. This information is not intended to replace advice given to you by your health care provider. Make sure you discuss any questions you have with your health care provider. Document Revised: 07/01/2020 Document Reviewed: 07/01/2020 Elsevier Patient Education  2021 [employer]vier Inc.  

## 2021-01-01 DIAGNOSIS — Z1211 Encounter for screening for malignant neoplasm of colon: Secondary | ICD-10-CM | POA: Diagnosis not present

## 2021-01-01 DIAGNOSIS — K5730 Diverticulosis of large intestine without perforation or abscess without bleeding: Secondary | ICD-10-CM | POA: Diagnosis not present

## 2021-01-01 DIAGNOSIS — R1032 Left lower quadrant pain: Secondary | ICD-10-CM | POA: Diagnosis not present

## 2021-01-01 DIAGNOSIS — Z800 Family history of malignant neoplasm of digestive organs: Secondary | ICD-10-CM | POA: Diagnosis not present

## 2021-01-02 DIAGNOSIS — K5730 Diverticulosis of large intestine without perforation or abscess without bleeding: Secondary | ICD-10-CM | POA: Diagnosis not present

## 2021-01-02 DIAGNOSIS — R1032 Left lower quadrant pain: Secondary | ICD-10-CM | POA: Diagnosis not present

## 2021-01-02 DIAGNOSIS — Z800 Family history of malignant neoplasm of digestive organs: Secondary | ICD-10-CM | POA: Diagnosis not present

## 2021-01-02 DIAGNOSIS — Z1211 Encounter for screening for malignant neoplasm of colon: Secondary | ICD-10-CM | POA: Diagnosis not present

## 2021-01-06 DIAGNOSIS — M25561 Pain in right knee: Secondary | ICD-10-CM | POA: Diagnosis not present

## 2021-01-22 DIAGNOSIS — R1033 Periumbilical pain: Secondary | ICD-10-CM | POA: Diagnosis not present

## 2021-01-22 DIAGNOSIS — Z800 Family history of malignant neoplasm of digestive organs: Secondary | ICD-10-CM | POA: Diagnosis not present

## 2021-01-22 DIAGNOSIS — K5730 Diverticulosis of large intestine without perforation or abscess without bleeding: Secondary | ICD-10-CM | POA: Diagnosis not present

## 2021-01-22 DIAGNOSIS — R152 Fecal urgency: Secondary | ICD-10-CM | POA: Diagnosis not present

## 2021-01-22 DIAGNOSIS — R194 Change in bowel habit: Secondary | ICD-10-CM | POA: Diagnosis not present

## 2021-01-22 DIAGNOSIS — Z1211 Encounter for screening for malignant neoplasm of colon: Secondary | ICD-10-CM | POA: Diagnosis not present

## 2021-01-22 DIAGNOSIS — R140 Abdominal distension (gaseous): Secondary | ICD-10-CM | POA: Diagnosis not present

## 2021-01-22 DIAGNOSIS — K219 Gastro-esophageal reflux disease without esophagitis: Secondary | ICD-10-CM | POA: Diagnosis not present

## 2021-01-30 ENCOUNTER — Telehealth: Payer: Self-pay

## 2021-01-30 NOTE — Telephone Encounter (Signed)
Pt called to inform that she will be having her records faxed over from Dr. Collene Mares for review because she wants to transfer her care, she has not been pleased with the care that she received there. She reported to have diverticulitis and feels like her health has been deteriorating. She does not have a preferred MD to review her records.

## 2021-02-16 ENCOUNTER — Ambulatory Visit (INDEPENDENT_AMBULATORY_CARE_PROVIDER_SITE_OTHER): Payer: PPO | Admitting: Medical-Surgical

## 2021-02-16 ENCOUNTER — Other Ambulatory Visit: Payer: Self-pay

## 2021-02-16 ENCOUNTER — Encounter: Payer: Self-pay | Admitting: Medical-Surgical

## 2021-02-16 VITALS — BP 120/79 | HR 66 | Wt 204.0 lb

## 2021-02-16 DIAGNOSIS — E118 Type 2 diabetes mellitus with unspecified complications: Secondary | ICD-10-CM

## 2021-02-16 DIAGNOSIS — R197 Diarrhea, unspecified: Secondary | ICD-10-CM

## 2021-02-16 DIAGNOSIS — E782 Mixed hyperlipidemia: Secondary | ICD-10-CM

## 2021-02-16 DIAGNOSIS — I10 Essential (primary) hypertension: Secondary | ICD-10-CM | POA: Diagnosis not present

## 2021-02-16 DIAGNOSIS — F3341 Major depressive disorder, recurrent, in partial remission: Secondary | ICD-10-CM

## 2021-02-16 LAB — POCT GLYCOSYLATED HEMOGLOBIN (HGB A1C): Hemoglobin A1C: 7.2 % — AB (ref 4.0–5.6)

## 2021-02-16 MED ORDER — SERTRALINE HCL 50 MG PO TABS: ORAL_TABLET | ORAL | 1 refills | Status: DC

## 2021-02-16 MED ORDER — ONETOUCH DELICA PLUS LANCET33G MISC: 1.0000 | Freq: Four times a day (QID) | 11 refills | Status: DC

## 2021-02-16 MED ORDER — ONETOUCH ULTRA VI STRP: 1.0000 | ORAL_STRIP | Freq: Two times a day (BID) | 11 refills | Status: DC

## 2021-02-16 MED ORDER — GLIPIZIDE ER 5 MG PO TB24: 5.0000 mg | ORAL_TABLET | Freq: Every day | ORAL | 1 refills | Status: DC

## 2021-02-16 MED ORDER — CHOLESTYRAMINE LIGHT 4 G PO PACK: 4.0000 g | PACK | Freq: Every day | ORAL | 0 refills | Status: DC | PRN

## 2021-02-16 MED ORDER — LOSARTAN POTASSIUM 50 MG PO TABS: 50.0000 mg | ORAL_TABLET | Freq: Every day | ORAL | 1 refills | Status: DC

## 2021-02-16 NOTE — Progress Notes (Signed)
Subjective:    CC: chronic disease follow up  HPI: Patricia Mills 75 year old female presenting for the following:  Diabetes- fasting glucose 100-110 with some a bit lower. Has had a couple of episodes where it dropped to 70.  Taking glipizide 5 mg daily and Lantus 15 mg nightly as prescribed, tolerating well without side effects.  HTN-taking losartan 50 mg daily, tolerating well without side effects. Denies CP, SOB, palpitations, lower extremity edema, dizziness, headaches, or vision changes.  Mood-previously taking Zoloft 50 mg daily, tolerating well without side effects.  She was advised to stop this by her GI provider who said the medication was likely causing her diarrhea.  She stopped this medication with a slow taper at the end of March and has not been taking any other medications for mood.  Endorses frequent crying episodes and would like to get started back on the sertraline today.  Denies SI/HI.  Diarrhea-she has had chronic diarrhea for several years.  Recent E. coli infection treated with ciprofloxacin and Bactrim.  Notes that the diarrhea during her E. coli infection was violent with fecal urgency.  The symptoms have resolved and she has returned to her normal chronic diarrhea.  Stools are loose and she has approximately 3-4 bowel movements per day.  She does still have some fecal urgency.  Tried dicyclomine but this was not helpful.  She is taking Imodium approximately 4 times daily.  I reviewed the past medical history, family history, social history, surgical history, and allergies today and no changes were needed.  Please see the problem list section below in epic for further details.  Past Medical History: Past Medical History:  Diagnosis Date  . Depression   . Diabetes mellitus without complication ([place])   . Diverticulitis   . History of colon polyps   . Hypertension   . Thyroid disease    Past Surgical History: Past Surgical History:  Procedure Laterality Date  .  ABDOMINAL HYSTERECTOMY    . CHOLECYSTECTOMY    . TONSILLECTOMY     Social History: Social History   Socioeconomic History  . Marital status: Divorced    Spouse name: Not on file  . Number of children: Not on file  . Years of education: Not on file  . Highest education level: Not on file  Occupational History  . Not on file  Tobacco Use  . Smoking status: Current Every Day Smoker    Packs/day: 0.25    Years: 20.00    Pack years: 5.00    Types: Cigarettes  . Smokeless tobacco: Never Used  Vaping Use  . Vaping Use: Never used  Substance and Sexual Activity  . Alcohol use: Not Currently  . Drug use: Never  . Sexual activity: Not Currently  Other Topics Concern  . Not on file  Social History Narrative  . Not on file   Social Determinants of Health   Financial Resource Strain: Not on file  Food Insecurity: Not on file  Transportation Needs: Not on file  Physical Activity: Not on file  Stress: Not on file  Social Connections: Not on file   Family History: Family History  Problem Relation Age of Onset  . Stroke Mother   . Colon cancer Other   . Breast cancer Other    Allergies: Allergies  Allergen Reactions  . Metformin Diarrhea  . Penicillins Itching and Rash   Medications: See med rec.  Review of Systems: See HPI for pertinent positives and negatives.   Objective:  General: Well Developed, well nourished, and in no acute distress.  Neuro: Alert and oriented x3.  HEENT: Normocephalic, atraumatic.  Skin: Warm and dry. Cardiac: Regular rate and rhythm, no murmurs rubs or gallops, no lower extremity edema.  Respiratory: Clear to auscultation bilaterally. Not using accessory muscles, speaking in full sentences.   Impression and Recommendations:    1. Type 2 diabetes mellitus with complication, without long-term current use of insulin (HCC) POCT hemoglobin A1c 7.2% today.  Suspect this is related to her recent infection and treatment with steroids.   Continue glipizide 5 mg daily and Lantus 15 units nightly.  Continue to follow a diabetic diet. - POCT HgB A1C  2. Essential hypertension Blood pressure at goal today.  Continue losartan 50 mg daily.  3. Recurrent major depressive disorder, in partial remission (HCC) Restart sertraline at 25 mg daily for 6 days and increase to 50 mg daily.  4. Diarrhea, unspecified type Continue Imodium up to 4 times daily.  Start Questran 4 g daily.  Return in about 3 months (around 05/19/2021) for DM/HTN/HLD follow up. ___________________________________________ Clearnce Sorrel, DNP, APRN, FNP-BC [facility] [place]

## 2021-03-02 ENCOUNTER — Encounter: Payer: Self-pay | Admitting: Medical-Surgical

## 2021-03-05 MED ORDER — ESCITALOPRAM OXALATE 10 MG PO TABS: 10.0000 mg | ORAL_TABLET | Freq: Every day | ORAL | 1 refills | Status: DC

## 2021-03-05 NOTE — Addendum Note (Signed)
Addended bySamuel Bouche on: 03/05/2021 11:40 AM   Modules accepted: Orders

## 2021-03-10 ENCOUNTER — Ambulatory Visit: Payer: PPO | Admitting: Podiatry

## 2021-03-10 ENCOUNTER — Ambulatory Visit (INDEPENDENT_AMBULATORY_CARE_PROVIDER_SITE_OTHER): Payer: PPO

## 2021-03-10 ENCOUNTER — Other Ambulatory Visit: Payer: Self-pay

## 2021-03-10 ENCOUNTER — Encounter: Payer: Self-pay | Admitting: Podiatry

## 2021-03-10 DIAGNOSIS — M19072 Primary osteoarthritis, left ankle and foot: Secondary | ICD-10-CM | POA: Diagnosis not present

## 2021-03-10 DIAGNOSIS — M21612 Bunion of left foot: Secondary | ICD-10-CM | POA: Diagnosis not present

## 2021-03-10 DIAGNOSIS — M21611 Bunion of right foot: Secondary | ICD-10-CM

## 2021-03-10 DIAGNOSIS — M19071 Primary osteoarthritis, right ankle and foot: Secondary | ICD-10-CM

## 2021-03-10 DIAGNOSIS — R109 Unspecified abdominal pain: Secondary | ICD-10-CM | POA: Insufficient documentation

## 2021-03-10 DIAGNOSIS — R1032 Left lower quadrant pain: Secondary | ICD-10-CM | POA: Insufficient documentation

## 2021-03-10 DIAGNOSIS — K5732 Diverticulitis of large intestine without perforation or abscess without bleeding: Secondary | ICD-10-CM | POA: Insufficient documentation

## 2021-03-10 DIAGNOSIS — R152 Fecal urgency: Secondary | ICD-10-CM | POA: Insufficient documentation

## 2021-03-10 DIAGNOSIS — K219 Gastro-esophageal reflux disease without esophagitis: Secondary | ICD-10-CM | POA: Insufficient documentation

## 2021-03-10 DIAGNOSIS — R140 Abdominal distension (gaseous): Secondary | ICD-10-CM | POA: Insufficient documentation

## 2021-03-10 DIAGNOSIS — M2042 Other hammer toe(s) (acquired), left foot: Secondary | ICD-10-CM

## 2021-03-10 DIAGNOSIS — M2041 Other hammer toe(s) (acquired), right foot: Secondary | ICD-10-CM | POA: Diagnosis not present

## 2021-03-10 DIAGNOSIS — R1033 Periumbilical pain: Secondary | ICD-10-CM | POA: Insufficient documentation

## 2021-03-10 DIAGNOSIS — K625 Hemorrhage of anus and rectum: Secondary | ICD-10-CM | POA: Insufficient documentation

## 2021-03-10 DIAGNOSIS — L600 Ingrowing nail: Secondary | ICD-10-CM

## 2021-03-10 DIAGNOSIS — Z800 Family history of malignant neoplasm of digestive organs: Secondary | ICD-10-CM | POA: Insufficient documentation

## 2021-03-10 DIAGNOSIS — R194 Change in bowel habit: Secondary | ICD-10-CM | POA: Insufficient documentation

## 2021-03-10 DIAGNOSIS — Z1211 Encounter for screening for malignant neoplasm of colon: Secondary | ICD-10-CM | POA: Insufficient documentation

## 2021-03-10 DIAGNOSIS — K5730 Diverticulosis of large intestine without perforation or abscess without bleeding: Secondary | ICD-10-CM | POA: Insufficient documentation

## 2021-03-10 NOTE — Patient Instructions (Signed)

## 2021-03-10 NOTE — Progress Notes (Signed)
Subjective:  Patient ID: TZIPPY TESTERMAN, female    DOB: Aug 24, 1946,  MRN: [id number]  Chief Complaint  Patient presents with  . Ingrown Toenail     (np) ingrown toenail right foot   . Diabetes    A1C 7.2  . Hammer Toe  . Bunions    75 y.o. female presents with the above complaint. History confirmed with patient.   Objective:  Physical Exam: warm, good capillary refill, no trophic changes or ulcerative lesions, normal DP and PT pulses and normal sensory exam.  Bilaterally she has moderate to severe hallux valgus with minimal range of motion with pain in the mid range of motion and second hammertoe deformities.  On the right hallux she has an ingrown lateral nail border  Radiographs: X-ray of both feet: Severe hallux valgus deformity with second hammertoe and osteoarthritis of the second PIPJ and first metatarsal phalangeal joint and sesamoid complex bilaterally, right worse than left overall Assessment:   1. Bilateral bunions   2. Ingrowing right great toenail   3. Hammertoe of left foot   4. Hammertoe of right foot   5. 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    Ingrown Nail, right -Patient elects to proceed with minor surgery to remove ingrown toenail today. Consent reviewed and signed by patient. -Ingrown nail excised. See procedure note. -Educated on post-procedure care including soaking. Written instructions provided and reviewed. -Patient to follow up in 2 weeks for nail check.  Procedure: Excision of Ingrown Toenail Location: Right 1st toe lateral nail borders. Anesthesia: Lidocaine 1% plain; 1.5 mL and Marcaine 0.5% plain; 1.5 mL, digital block. Skin Prep: Betadine. Dressing: Silvadene; telfa; dry, sterile, compression dressing. Technique: Following skin prep, the toe was exsanguinated and a tourniquet was secured at the base of the toe. The affected nail  border was freed, split with a nail splitter, and excised. Chemical matrixectomy was then performed with phenol and irrigated out with alcohol. The tourniquet was then removed and sterile dressing applied. Disposition: Patient tolerated procedure well. Patient to return in 2 weeks for follow-up.      Discussed the etiology and treatment including surgical and non surgical treatment for painful bunions and hammertoes.  She has exhausted all non surgical treatment prior to this visit including shoe gear changes and padding.  She desires surgical intervention. We discussed all risks including but not limited to: pain, swelling, infection, scar, numbness which may be temporary or permanent, chronic pain, stiffness, nerve pain or damage, wound healing problems, bone healing problems including delayed or non-union and recurrence. Specifically we discussed the following procedures: First metatarsophalangeal joint arthrodesis and second hammertoe correction with autograft from heel, right foot first than left. Informed consent was signed today. Surgery will be scheduled at a mutually agreeable date. Information regarding this will be forwarded to our surgery scheduler.   Surgical plan:  Procedure: -Right foot first metatarsophalangeal joint arthrodesis with calcaneal autograft, second hammertoe correction  Location: -[facility]  Anesthesia plan: -IV sedation with block  Postoperative pain plan: - Tylenol 1000 mg every 6 hours, ibuprofen 600 mg every 6 hours, gabapentin 300 mg every 8 hours x5 days, oxycodone 5 mg 1-2 tabs every 6 hours only as needed  DVT prophylaxis: -Aspirin 325 twice daily  WB Restrictions / DME needs: -NWB in CAM boot, will dispense at next visit before surgery  No follow-ups on file.

## 2021-03-30 ENCOUNTER — Telehealth: Payer: Self-pay

## 2021-03-30 NOTE — Telephone Encounter (Signed)
Marice called to cancel her surgery with Dr. Sherryle Lis on 05/08/2021. She stated she is in the process of moving and doesn't think she will be where she needs to be for the surgery. She stated she will call me back to reschedule. Notified Dr. Sherryle Lis and centralized scheduling.

## 2021-03-30 NOTE — Telephone Encounter (Signed)
Thanks

## 2021-05-04 ENCOUNTER — Other Ambulatory Visit: Payer: Self-pay | Admitting: Medical-Surgical

## 2021-05-04 DIAGNOSIS — M47816 Spondylosis without myelopathy or radiculopathy, lumbar region: Secondary | ICD-10-CM | POA: Diagnosis not present

## 2021-05-04 DIAGNOSIS — M47817 Spondylosis without myelopathy or radiculopathy, lumbosacral region: Secondary | ICD-10-CM | POA: Diagnosis not present

## 2021-05-08 ENCOUNTER — Encounter (HOSPITAL_BASED_OUTPATIENT_CLINIC_OR_DEPARTMENT_OTHER): Payer: Self-pay

## 2021-05-08 ENCOUNTER — Ambulatory Visit (HOSPITAL_BASED_OUTPATIENT_CLINIC_OR_DEPARTMENT_OTHER): Admit: 2021-05-08 | Payer: PPO | Admitting: Podiatry

## 2021-05-08 SURGERY — FUSION, JOINT, GREAT TOE
Anesthesia: Choice | Laterality: Right

## 2021-05-14 ENCOUNTER — Encounter: Payer: PPO | Admitting: Podiatry

## 2021-05-19 ENCOUNTER — Ambulatory Visit (INDEPENDENT_AMBULATORY_CARE_PROVIDER_SITE_OTHER): Payer: PPO | Admitting: Medical-Surgical

## 2021-05-19 ENCOUNTER — Encounter: Payer: Self-pay | Admitting: Medical-Surgical

## 2021-05-19 VITALS — BP 134/77 | HR 62 | Resp 20 | Wt 202.5 lb

## 2021-05-19 DIAGNOSIS — E118 Type 2 diabetes mellitus with unspecified complications: Secondary | ICD-10-CM

## 2021-05-19 DIAGNOSIS — E782 Mixed hyperlipidemia: Secondary | ICD-10-CM | POA: Diagnosis not present

## 2021-05-19 DIAGNOSIS — I10 Essential (primary) hypertension: Secondary | ICD-10-CM

## 2021-05-19 LAB — POCT GLYCOSYLATED HEMOGLOBIN (HGB A1C): HbA1c, POC (controlled diabetic range): 6.5 % (ref 0.0–7.0)

## 2021-05-19 NOTE — Progress Notes (Signed)
HPI with pertinent ROS:   CC: Hypertension, hyperlipidemia, diabetes follow-up  HPI: Very pleasant 75 year old female presenting today for the following:  Hypertension-taking losartan 50 mg daily as prescribed, tolerating well without side effects.  Uses furosemide 40 mg daily as needed with very sparing use.  Checks blood pressures at home with home readings of 130s/70s.  She is not currently exercising but she has been exceedingly active as she is moving out of her home that she has lived in for the last 50 years. Denies CP, SOB, palpitations, lower extremity edema, dizziness, headaches, or vision changes.  Hyperlipidemia-she is not currently on any medications to manage her cholesterol.  She has heard about statin medications and admits that she is somewhat afraid of them.  She has never tried a medication but is open to options.  Diabetes-recently had a 6-day dose of prednisone which caused her sugars to be quite elevated.  Her normal fasting blood sugars are anywhere from 85-1 10 in the mornings.  She is taking glipizide 5 mg daily and using Lantus 15 mg once daily.  Tolerating both medications well without difficulties or side effects.  Notes that her highest time of glucose is usually between lunch and dinner where she does see readings up in the 230s at times.  Mood-she is currently taking Lexapro 10 mg daily and using Xanax 0.25 mg as needed.  She notes that she does not take Xanax every day but only when needed.  She is having quite a difficult time with the change in address.  She has lived in [place] all of her life and has been in her current house for 50 years.  There are a lot of memories and a lot of stress involved in her relocation but she is moving to [place] to be closer to her daughters.  She is doing fairly well handling the stress and the change but she does note that she does cry at times.  She feels that this is a temporary change and that she does not want to  increase or change her medications at this point.  Denies SI/HI  I reviewed the past medical history, family history, social history, surgical history, and allergies today and no changes were needed.  Please see the problem list section below in epic for further details.   Physical exam:   General: Well Developed, well nourished, and in no acute distress.  Neuro: Alert and oriented x3.  HEENT: Normocephalic, atraumatic.  Skin: Warm and dry. Cardiac: Regular rate and rhythm, no murmurs rubs or gallops, no lower extremity edema.  Respiratory: Clear to auscultation bilaterally. Not using accessory muscles, speaking in full sentences.  Impression and Recommendations:    1. Type 2 diabetes mellitus with complication, without long-term current use of insulin (HCC) POCT hemoglobin A1c 6.5% today.  Patient is very surprised and very happy about this.  Continue glipizide 5 mg XL daily.  Continue Lantus 15 units daily.  Checking labs.  2. Mixed hyperlipidemia Checking lipid panel today.  Discussed recommendations for management of high cholesterol and diabetics.  She is open to options and if her cholesterol remains outside of the goal for diabetes, she is willing to try a low-dose of a statin medication  3. Essential hypertension Blood pressure is at goal today.  Continue losartan 50 mg daily.  Continue furosemide 40 mg daily as needed.  Return in about 6 months (around 11/19/2021) for DM/HTN/HLD follow up. ___________________________________________ Clearnce Sorrel, DNP, APRN, FNP-BC [facility]  [place]

## 2021-05-19 NOTE — Patient Instructions (Signed)
Statin Intolerance Statin intolerance is the inability to take a certain type of cholesterol-lowering medicine (statin) because of unwanted side effects, such as muscle pain. Statins may be prescribed to improve cholesterol levels and lower the risk for heart attack, heart disease, and stroke. People with statin intolerance may experience muscle pain and cramps (myalgia) that go away when the statin is stopped. What are the causes? The cause of this condition is not known. What increases the risk? You may be at higher risk for statin intolerance if you: Take more than one type of cholesterol-lowering medicine at a time. Need a higher than normal dosage of a statin. Have a history of high CK (creatine kinase) in the blood. CK is an enzyme that is released when muscle tissue is damaged. Are a woman. Have a body size that is smaller than normal. Are age 20 or older. Drink a lot of alcohol. Are of Asian descent. Have kidney, liver, or muscle disease. Have a low level of hormones that control how your body uses energy (hypothyroidism). Take certain medicines, including: Certain medicines for mental illness (antipsychotics). Some types of antibiotics. Certain medicines used for blood pressure or heart disease. Medicines that reduce the activity of the body's disease-fighting system (immunosuppressants). Medicines to treat hepatitis C and HIV/AIDS (human immunodeficiency virus/acquired immunodeficiency syndrome). Follow an intense exercise program. Drink a lot of grapefruit juice. Have a lack of vitamin D (deficiency). What are the signs or symptoms? Signs and symptoms of statin intolerance include: General muscle aches (myalgia). This may feel similar to muscle aches that are caused by the flu. Muscle pain, tenderness, cramps, or weakness (myositis). Severe muscle pain, weakness, and raised blood CK levels (rhabdomyolysis). Symptoms usually go away when the statin is stopped. Rarely, liver  damage can also occur, which can cause: Loss of appetite. Pain in the upper right abdomen. Yellowing of the skin or the white parts of the eyes (jaundice). How is this diagnosed? This condition is diagnosed based on your symptoms, your medical history, and aphysical exam. You may also have blood tests. How is this treated? Your health care provider may have you stop taking the statin for a short time to see if your symptoms go away. Then your provider may restart your statin, or: Change you to a different statin. Lower the dosage of your statin. Have you take your statin less often. Change you to another type of cholesterol-lowering medicine. Stop or change any medicines that might be interfering with your statin. Limit how much grapefruit juice you drink. Recommend stopping intense exercise. Follow these instructions at home: Medicines Take your statin medicine as told by your health care provider. Do not stop taking the statin unless your health care provider tells you to stop. Take other over-the-counter and prescription medicines only as told by your health care provider. Check with your health care provider before taking any new medicines. Certain medicines can increase your risk for statin intolerance. Lifestyle Limit alcohol intake to no more than 1 drink a day for nonpregnant women and 2 drinks a day for men. One drink equals 12 oz of beer, 5 oz of wine, or 1 oz of hard liquor. Do not use any products that contain nicotine or tobacco, such as cigarettes and e-cigarettes. If you need help quitting, ask your health care provider. General instructions  Have blood tests to check CK levels or liver enzymes as told by your health care provider. Exercise as directed. Ask your health care provider what exercises are best  for you. Do not start a new exercise program before talking about it with your health care provider. Follow instructions from your health care provider about eating or  drinking restrictions. Your health care provider may recommend: Limiting the amount of grapefruit juice you drink, or not drinking it at all. Eating a diet that is low in saturated fats and high in fiber. Maintain a healthy weight with diet and exercise. Keep all follow-up visits as told by your health care provider. This is important.  Contact a health care provider if: You have any symptoms of statin intolerance. Summary Statins are important medicines for improving your cholesterol, which may reduce your risk for heart attack and stroke. Some people are not able to continue taking a particular statin because of muscle problems (myalgia) or other side effects. Myalgia is the most common symptom of statin intolerance. Often, the muscle pain and cramps from myalgia go away when the statin is stopped. Although rare, liver damage can occur as a result of statin intolerance. You should have routine blood tests to check your liver enzymes. In most cases, statin intolerance can be managed and you can continue to take a cholesterol-lowering medicine. This information is not intended to replace advice given to you by your health care provider. Make sure you discuss any questions you have with your healthcare provider. Document Revised: 09/24/2020 Document Reviewed: 01/18/2020 Elsevier Patient Education  [place].

## 2021-05-21 DIAGNOSIS — M47816 Spondylosis without myelopathy or radiculopathy, lumbar region: Secondary | ICD-10-CM | POA: Diagnosis not present

## 2021-05-22 ENCOUNTER — Encounter: Payer: Self-pay | Admitting: Medical-Surgical

## 2021-05-22 MED ORDER — INSULIN GLARGINE 100 UNIT/ML SOLOSTAR PEN: 15.0000 [IU] | PEN_INJECTOR | Freq: Every evening | SUBCUTANEOUS | 0 refills | Status: DC

## 2021-05-22 NOTE — Telephone Encounter (Signed)
We have not prescribed this medication for the patient previously.  Please review and refill if appropriate.  T. Elizet Kaplan, CMA  

## 2021-05-28 ENCOUNTER — Encounter: Payer: PPO | Admitting: Podiatry

## 2021-06-08 ENCOUNTER — Other Ambulatory Visit: Payer: Self-pay | Admitting: Medical-Surgical

## 2021-06-18 ENCOUNTER — Encounter: Payer: PPO | Admitting: Podiatry

## 2021-07-06 ENCOUNTER — Other Ambulatory Visit: Payer: Self-pay | Admitting: Medical-Surgical

## 2021-08-10 ENCOUNTER — Other Ambulatory Visit: Payer: Self-pay | Admitting: Medical-Surgical

## 2021-08-21 ENCOUNTER — Other Ambulatory Visit: Payer: Self-pay | Admitting: Medical-Surgical

## 2021-09-01 ENCOUNTER — Other Ambulatory Visit: Payer: Self-pay | Admitting: Medical-Surgical

## 2021-09-07 ENCOUNTER — Other Ambulatory Visit: Payer: Self-pay | Admitting: Medical-Surgical

## 2021-09-08 ENCOUNTER — Ambulatory Visit: Payer: PPO | Admitting: Podiatry

## 2021-09-08 ENCOUNTER — Other Ambulatory Visit: Payer: Self-pay

## 2021-09-08 DIAGNOSIS — M21612 Bunion of left foot: Secondary | ICD-10-CM

## 2021-09-08 DIAGNOSIS — M7662 Achilles tendinitis, left leg: Secondary | ICD-10-CM | POA: Diagnosis not present

## 2021-09-08 DIAGNOSIS — M19071 Primary osteoarthritis, right ankle and foot: Secondary | ICD-10-CM | POA: Diagnosis not present

## 2021-09-08 DIAGNOSIS — M19072 Primary osteoarthritis, left ankle and foot: Secondary | ICD-10-CM | POA: Diagnosis not present

## 2021-09-08 DIAGNOSIS — M21611 Bunion of right foot: Secondary | ICD-10-CM

## 2021-09-08 DIAGNOSIS — M2041 Other hammer toe(s) (acquired), right foot: Secondary | ICD-10-CM | POA: Diagnosis not present

## 2021-09-08 MED ORDER — METHYLPREDNISOLONE 4 MG PO TBPK: ORAL_TABLET | ORAL | 0 refills | Status: DC

## 2021-09-08 NOTE — Patient Instructions (Signed)

## 2021-09-08 NOTE — Progress Notes (Signed)
  Subjective:  Patient ID: Patricia Mills, female    DOB: January 15, 1946,  MRN: [id number]  Chief Complaint  Patient presents with   Foot Pain    L bottom of heel having pain -there is a knot on the back of heel as well with alot of pain - diabetic    75 y.o. female presents with the above complaint. History confirmed with patient.  Her bunions are still painful and she would like to reschedule surgery for the end of winter.  She has a new issue of pain in the posterior heel on the left foot that is quite limiting.  She is in the process of moving currently  Objective:  Physical Exam: warm, good capillary refill, no trophic changes or ulcerative lesions, normal DP and PT pulses and normal sensory exam.  Bilaterally she has moderate to severe hallux valgus with minimal range of motion with pain in the mid range of motion and second hammertoe deformities.  She has pain in the Achilles insertion on the left foot with a palpable knot  Radiographs: X-ray of both feet: Severe hallux valgus deformity with second hammertoe and osteoarthritis of the second PIPJ and first metatarsal phalangeal joint and sesamoid complex bilaterally, right worse than left overall, prior left foot x-rays show posterior calcaneal spur in the area of pain Assessment:   1. Achilles tendinitis of left lower extremity   2. Bilateral bunions   3. Hammertoe of right foot   4. 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    Discussed the etiology and treatment options for Achilles tendinitis including stretching, formal physical therapy with an eccentric exercises therapy plan, supportive shoegears such as a running shoe or sneaker, heel lifts, topical and oral medications.  We also discussed that I do not routinely perform injections in this area because of the risk of an increased damage or rupture of the tendon.  We also  discussed the role of surgical treatment of this for patients who do not improve after exhausting non-surgical treatment options.  -XR reviewed with patient -Educated on stretching and icing of the affected limb.  Home exercise plan given -Methylprednisolone Dosepak given.  Educated on use side effects and administration -Heel lifts and silicone offloading pad dispensed    Previously we schedule surgery for right foot first metatarsophalangeal joint arthrodesis and second hammertoe correction with autograft from heel and has signed consent and we ended up up rescheduling it.  She is almost finished moving and is in a single level home.  She would like to reschedule her surgery for end of winter.  We will plan for the end of February.  Surgical plan:  Procedure: -Right foot first metatarsophalangeal joint arthrodesis with calcaneal autograft, second hammertoe correction  Location: -[facility]  Anesthesia plan: -IV sedation with block  Postoperative pain plan: - Tylenol 1000 mg every 6 hours, ibuprofen 600 mg every 6 hours, gabapentin 300 mg every 8 hours x5 days, oxycodone 5 mg 1-2 tabs every 6 hours only as needed  DVT prophylaxis: -Aspirin 325 twice daily  WB Restrictions / DME needs: -NWB in CAM boot, will dispense at next visit before surgery  Return in about 6 weeks (around 10/20/2021) for re-check Achilles tendon.

## 2021-09-22 ENCOUNTER — Encounter: Payer: Self-pay | Admitting: Medical-Surgical

## 2021-09-23 ENCOUNTER — Other Ambulatory Visit: Payer: Self-pay | Admitting: Medical-Surgical

## 2021-09-23 MED ORDER — ALPRAZOLAM 0.5 MG PO TABS: 0.5000 mg | ORAL_TABLET | Freq: Every day | ORAL | 2 refills | Status: AC | PRN

## 2021-09-28 DIAGNOSIS — M25511 Pain in right shoulder: Secondary | ICD-10-CM | POA: Diagnosis not present

## 2021-10-08 ENCOUNTER — Other Ambulatory Visit: Payer: Self-pay | Admitting: Medical-Surgical

## 2021-10-13 ENCOUNTER — Other Ambulatory Visit: Payer: Self-pay

## 2021-10-13 ENCOUNTER — Encounter: Payer: Self-pay | Admitting: Family Medicine

## 2021-10-13 ENCOUNTER — Ambulatory Visit (INDEPENDENT_AMBULATORY_CARE_PROVIDER_SITE_OTHER): Payer: PPO | Admitting: Family Medicine

## 2021-10-13 VITALS — BP 142/81 | HR 63 | Temp 98.1°F | Ht 68.5 in | Wt 203.0 lb

## 2021-10-13 DIAGNOSIS — R252 Cramp and spasm: Secondary | ICD-10-CM

## 2021-10-13 NOTE — Patient Instructions (Signed)
Labs today to check for any abnormalities. We will let you know results and any changes to plan of care.  Try to stay well hydrated and eat a balanced diet.    Please contact office for follow-up if symptoms do not improve or worsen. Seek emergency care if symptoms become severe.

## 2021-10-13 NOTE — Progress Notes (Signed)
Acute Office Visit  Subjective:    Patient ID: Patricia Mills, female    DOB: 1946/01/30, 76 y.o.   MRN: [id number]  Chief Complaint  Patient presents with   Spasms    Randomly all over her body x - 6 days    HPI Patient is in today for muscle cramps.    Patient reports that for the past 6 days/nights she has been experiencing sporadic muscle cramps all over her body. States the first night it woke her up from her sleep and has continued. She will have muscle cramps/spasms every couple hours in different body parts - hands, feet, legs, side, etc. Once she moves around or stretches, the cramp will eventually resolve on its own. She has been trying to stay well hydrated and add electrolyte drink mixes to her water. She denies any strenuous exercise, dehydration, diet changes. The only new thing she can think of is being on prednisone for several days about 2 weeks ago for shoulder bursitis. She denies any chest pain, palpitations, dyspnea, pain, fatigue.      Past Medical History:  Diagnosis Date   Depression    Diabetes mellitus without complication ([place])    Diverticulitis    History of colon polyps    Hypertension    Thyroid disease     Past Surgical History:  Procedure Laterality Date   ABDOMINAL HYSTERECTOMY     CHOLECYSTECTOMY     TONSILLECTOMY      Family History  Problem Relation Age of Onset   Stroke Mother    Colon cancer Other    Breast cancer Other     Social History   Socioeconomic History   Marital status: Divorced    Spouse name: Not on file   Number of children: Not on file   Years of education: Not on file   Highest education level: Not on file  Occupational History   Not on file  Tobacco Use   Smoking status: Every Day    Packs/day: 0.25    Years: 20.00    Pack years: 5.00    Types: Cigarettes   Smokeless tobacco: Never  Vaping Use   Vaping Use: Never used  Substance and Sexual Activity   Alcohol use: Not Currently   Drug use: Never    Sexual activity: Not Currently  Other Topics Concern   Not on file  Social History Narrative   Not on file   Social Determinants of Health   Financial Resource Strain: Not on file  Food Insecurity: Not on file  Transportation Needs: Not on file  Physical Activity: Not on file  Stress: Not on file  Social Connections: Not on file  Intimate Partner Violence: Not on file    Outpatient Medications Prior to Visit  Medication Sig Dispense Refill   acetaminophen (TYLENOL) 500 MG tablet Take 500 mg by mouth every 6 (six) hours as needed for moderate pain.     albuterol (VENTOLIN HFA) 108 (90 Base) MCG/ACT inhaler Inhale 2 puffs into the lungs every 6 (six) hours as needed for wheezing. 8 g 6   ALPRAZolam (XANAX) 0.5 MG tablet Take 1 tablet (0.5 mg total) by mouth daily as needed for anxiety. Use VERY SPARINGLY to prevent dependence and tolerance. Aim for no more than 2-3 doses per week. 30 tablet 2   aspirin 81 MG chewable tablet Chew 81 mg by mouth daily.     calcium carbonate (OS-CAL) 600 MG TABS tablet Take 600 mg by mouth  daily.     escitalopram (LEXAPRO) 10 MG tablet Take 1 tablet by mouth once daily 90 tablet 0   furosemide (LASIX) 40 MG tablet Take 1 tablet (40 mg total) by mouth daily. (Patient taking differently: Take 40 mg by mouth daily as needed.) 5 tablet 0   glipiZIDE (GLUCOTROL XL) 5 MG 24 hr tablet Take 1 tablet by mouth once daily 90 tablet 0   Lancets (ONETOUCH DELICA PLUS NOMVEH20N) MISC 1 each by Does not apply route 4 (four) times daily. 100 each 11   LANTUS SOLOSTAR 100 UNIT/ML Solostar Pen INJECT 15 UNITS SUBCUTANEOUSLY AT BEDTIME 15 mL 0   loperamide (IMODIUM) 2 MG capsule Take 2 mg by mouth daily as needed for diarrhea or loose stools.     loratadine (CLARITIN) 10 MG tablet Take 10 mg by mouth daily as needed.     losartan (COZAAR) 50 MG tablet Take 1 tablet by mouth once daily 90 tablet 0   methylPREDNISolone (MEDROL DOSEPAK) 4 MG TBPK tablet 6 day dose pack -  take as directed 21 tablet 0   ONETOUCH ULTRA test strip 1 each by Other route 2 (two) times daily. 100 each 11   No facility-administered medications prior to visit.    Allergies  Allergen Reactions   Metformin Diarrhea   Penicillins Itching and Rash    Review of Systems All review of systems negative except what is listed in the HPI     Objective:    Physical Exam Vitals reviewed.  Constitutional:      Appearance: Normal appearance.  HENT:     Head: Normocephalic and atraumatic.  Cardiovascular:     Rate and Rhythm: Normal rate and regular rhythm.  Pulmonary:     Effort: Pulmonary effort is normal.     Breath sounds: Normal breath sounds.  Musculoskeletal:        General: No swelling or tenderness. Normal range of motion.  Skin:    General: Skin is warm and dry.     Findings: No rash.  Neurological:     General: No focal deficit present.     Mental Status: She is alert and oriented to person, place, and time.  Psychiatric:        Mood and Affect: Mood normal.        Behavior: Behavior normal.        Thought Content: Thought content normal.        Judgment: Judgment normal.    BP (!) 161/86 (BP Location: Left Arm, Patient Position: Sitting, Cuff Size: Normal)    Pulse 67    Temp 98.1 F (36.7 C) (Oral)    Ht 5' 8.5" (1.74 m)    Wt 203 lb 0.6 oz (92.1 kg)    SpO2 96%    BMI 30.42 kg/m  Wt Readings from Last 3 Encounters:  10/13/21 203 lb 0.6 oz (92.1 kg)  05/19/21 202 lb 8 oz (91.9 kg)  02/16/21 204 lb 0.6 oz (92.6 kg)    Health Maintenance Due  Topic Date Due   OPHTHALMOLOGY EXAM  09/13/2019    There are no preventive care reminders to display for this patient.   No results found for: TSH Lab Results  Component Value Date   WBC 10.0 07/02/2019   HGB 10.7 (L) 07/02/2019   HCT 32.9 (L) 07/02/2019   MCV 97.1 07/02/2019   PLT 215 07/02/2019   Lab Results  Component Value Date   NA 138 07/01/2019   K 3.8 07/01/2019  CO2 23 07/01/2019   GLUCOSE  119 (H) 07/01/2019   BUN 15 07/01/2019   CREATININE 0.97 07/01/2019   BILITOT 0.4 07/01/2019   ALKPHOS 46 07/01/2019   AST 19 07/01/2019   ALT 13 07/01/2019   PROT 6.5 07/01/2019   ALBUMIN 3.7 07/01/2019   CALCIUM 9.0 07/01/2019   ANIONGAP 8 07/01/2019   No results found for: CHOL No results found for: HDL No results found for: LDLCALC No results found for: TRIG No results found for: [facility] Lab Results  Component Value Date   HGBA1C 6.5 05/19/2021       Assessment & Plan:   1. Muscle cramps No red flags today. Starting by checking labs. We will update her of results and plan of care. Recommend adequate hydration. Patient aware of signs/symptoms requiring further/urgent evaluation.  - CBC with Differential/Platelet - Comprehensive metabolic panel - Magnesium   Follow-up pending results or as needed.   Terrilyn Saver, NP

## 2021-10-14 LAB — COMPREHENSIVE METABOLIC PANEL
AG Ratio: 1.6 (calc) (ref 1.0–2.5)
ALT: 14 U/L (ref 6–29)
AST: 16 U/L (ref 10–35)
Albumin: 4.2 g/dL (ref 3.6–5.1)
Alkaline phosphatase (APISO): 48 U/L (ref 37–153)
BUN/Creatinine Ratio: 26 (calc) — ABNORMAL HIGH (ref 6–22)
BUN: 26 mg/dL — ABNORMAL HIGH (ref 7–25)
CO2: 27 mmol/L (ref 20–32)
Calcium: 9.8 mg/dL (ref 8.6–10.4)
Chloride: 103 mmol/L (ref 98–110)
Creat: 0.99 mg/dL (ref 0.60–1.00)
Globulin: 2.6 g/dL (calc) (ref 1.9–3.7)
Glucose, Bld: 99 mg/dL (ref 65–99)
Potassium: 4.5 mmol/L (ref 3.5–5.3)
Sodium: 137 mmol/L (ref 135–146)
Total Bilirubin: 0.4 mg/dL (ref 0.2–1.2)
Total Protein: 6.8 g/dL (ref 6.1–8.1)

## 2021-10-14 LAB — CBC WITH DIFFERENTIAL/PLATELET
Absolute Monocytes: 935 cells/uL (ref 200–950)
Basophils Absolute: 50 cells/uL (ref 0–200)
Basophils Relative: 0.3 %
Eosinophils Absolute: 217 cells/uL (ref 15–500)
Eosinophils Relative: 1.3 %
HCT: 41.1 % (ref 35.0–45.0)
Hemoglobin: 14 g/dL (ref 11.7–15.5)
Lymphs Abs: 6363 cells/uL — ABNORMAL HIGH (ref 850–3900)
MCH: 31.7 pg (ref 27.0–33.0)
MCHC: 34.1 g/dL (ref 32.0–36.0)
MCV: 93.2 fL (ref 80.0–100.0)
MPV: 10.5 fL (ref 7.5–12.5)
Monocytes Relative: 5.6 %
Neutro Abs: 9135 cells/uL — ABNORMAL HIGH (ref 1500–7800)
Neutrophils Relative %: 54.7 %
Platelets: 267 10*3/uL (ref 140–400)
RBC: 4.41 10*6/uL (ref 3.80–5.10)
RDW: 12.2 % (ref 11.0–15.0)
Total Lymphocyte: 38.1 %
WBC: 16.7 10*3/uL — ABNORMAL HIGH (ref 3.8–10.8)

## 2021-10-14 LAB — MAGNESIUM: Magnesium: 2.3 mg/dL (ref 1.5–2.5)

## 2021-10-20 ENCOUNTER — Other Ambulatory Visit: Payer: Self-pay

## 2021-10-20 ENCOUNTER — Ambulatory Visit: Payer: PPO | Admitting: Podiatry

## 2021-10-20 DIAGNOSIS — M19072 Primary osteoarthritis, left ankle and foot: Secondary | ICD-10-CM | POA: Diagnosis not present

## 2021-10-20 DIAGNOSIS — M85861 Other specified disorders of bone density and structure, right lower leg: Secondary | ICD-10-CM | POA: Diagnosis not present

## 2021-10-20 DIAGNOSIS — M2041 Other hammer toe(s) (acquired), right foot: Secondary | ICD-10-CM

## 2021-10-20 DIAGNOSIS — M19071 Primary osteoarthritis, right ankle and foot: Secondary | ICD-10-CM

## 2021-10-22 NOTE — Progress Notes (Signed)
°  Subjective:  Patient ID: Patricia Mills, female    DOB: 12/19/1945,  MRN: [id number]  Chief Complaint  Patient presents with   Tendonitis    F/U Lt tendonitis -pt states," doing much better." - minor pain; 2/10 - worse with cold weather Tx: stretchings, heel lifts and voltaren     76 y.o. female presents with the above complaint. History confirmed with patient.  Her bunions are still painful and she is ready now reschedule surgery.  Achilles is doing much better with home therapy  Objective:  Physical Exam: warm, good capillary refill, no trophic changes or ulcerative lesions, normal DP and PT pulses and normal sensory exam.  Bilaterally she has moderate to severe hallux valgus with minimal range of motion with pain in the mid range of motion and second hammertoe deformities.  She has pain in the Achilles insertion on the left foot with a palpable knot  Radiographs: X-ray of both feet: Severe hallux valgus deformity with second hammertoe and osteoarthritis of the second PIPJ and first metatarsal phalangeal joint and sesamoid complex bilaterally, right worse than left overall, prior left foot x-rays show posterior calcaneal spur in the area of pain Assessment:   1. Other specified disorders of bone density and structure, right lower leg   2. 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 Hammertoe of right foot       Plan:  Patient was evaluated and treated and all questions answered.   Achilles tendinitis is quiescent now and no longer bothersome    Previously we schedule surgery for right foot first metatarsophalangeal joint arthrodesis and second hammertoe correction with autograft from heel and has signed consent.  We reviewed the consent today and resigned it.  Surgery will be scheduled for April now that she has almost finished moving and is in a single level home  Surgical plan:  Procedure: -Right foot first metatarsophalangeal joint  arthrodesis with calcaneal autograft, second hammertoe correction  Location: -[place]  Anesthesia plan: -IV sedation with block  Postoperative pain plan: - Tylenol 1000 mg every 6 hours, ibuprofen 600 mg every 6 hours, gabapentin 300 mg every 8 hours x5 days, oxycodone 5 mg 1-2 tabs every 6 hours only as needed  DVT prophylaxis: -Aspirin 325 twice daily  WB Restrictions / DME needs: -WBAT in CAM boot, will dispense at next visit before surgery  No follow-ups on file.

## 2021-11-17 DIAGNOSIS — M19071 Primary osteoarthritis, right ankle and foot: Secondary | ICD-10-CM | POA: Diagnosis not present

## 2021-11-17 DIAGNOSIS — M85861 Other specified disorders of bone density and structure, right lower leg: Secondary | ICD-10-CM | POA: Diagnosis not present

## 2021-11-17 DIAGNOSIS — M19072 Primary osteoarthritis, left ankle and foot: Secondary | ICD-10-CM | POA: Diagnosis not present

## 2021-11-17 DIAGNOSIS — M1712 Unilateral primary osteoarthritis, left knee: Secondary | ICD-10-CM | POA: Diagnosis not present

## 2021-11-17 DIAGNOSIS — M25562 Pain in left knee: Secondary | ICD-10-CM | POA: Diagnosis not present

## 2021-11-17 DIAGNOSIS — M2041 Other hammer toe(s) (acquired), right foot: Secondary | ICD-10-CM | POA: Diagnosis not present

## 2021-11-18 ENCOUNTER — Other Ambulatory Visit: Payer: Self-pay | Admitting: Medical-Surgical

## 2021-11-18 LAB — VITAMIN D 25 HYDROXY (VIT D DEFICIENCY, FRACTURES): Vit D, 25-Hydroxy: 34.8 ng/mL (ref 30.0–100.0)

## 2021-11-19 ENCOUNTER — Ambulatory Visit: Payer: PPO | Admitting: Medical-Surgical

## 2021-11-27 ENCOUNTER — Ambulatory Visit (INDEPENDENT_AMBULATORY_CARE_PROVIDER_SITE_OTHER): Payer: PPO | Admitting: Medical-Surgical

## 2021-11-27 ENCOUNTER — Other Ambulatory Visit: Payer: Self-pay

## 2021-11-27 ENCOUNTER — Encounter: Payer: Self-pay | Admitting: Medical-Surgical

## 2021-11-27 VITALS — BP 148/81 | HR 59 | Resp 20 | Ht 68.5 in | Wt 205.8 lb

## 2021-11-27 DIAGNOSIS — I10 Essential (primary) hypertension: Secondary | ICD-10-CM

## 2021-11-27 DIAGNOSIS — E118 Type 2 diabetes mellitus with unspecified complications: Secondary | ICD-10-CM

## 2021-11-27 LAB — POCT GLYCOSYLATED HEMOGLOBIN (HGB A1C): HbA1c, POC (controlled diabetic range): 7.1 % — AB (ref 0.0–7.0)

## 2021-11-27 NOTE — Progress Notes (Signed)
Medical screening examination/treatment was performed by qualified nurse practitioner student and as supervising provider I was immediately available for consultation/collaboration. I have reviewed documentation and agree with assessment and plan. ° °Mellony Danziger L. Ziaire Bieser, DNP, APRN, FNP-BC °[facility] °Primary Care and Sports Medicine ° °

## 2021-11-27 NOTE — Progress Notes (Addendum)
°  HPI with pertinent ROS:   CC: DM/HTN follow up  [id number] 76 y.o. female presenting for follow up on HTN and DM. Taking glipizide every morning and lantus at night. Morning sugars are normal, but reports after lunch/afternoon sugars >200 and reports visual change. States she has been on 2 dose packs of steroids recently for musculoskeletal issues. She is trying to watch her diet, she does not eat pasta, rice, or potatoes and limits her sugar. She has not been exercising as she is in the process of moving and unpacking and cleaning new house.   Taking losartan daily with no concerns. Denies chest pain, SOB, palpitations, headaches. Not adding salt to foods. Taking blood pressure at home and systolic range in the [id number].    I reviewed the past medical history, family history, social history, surgical history, and allergies today and no changes were needed.  Please see the problem list section below in epic for further details.   Physical exam:   General: Well Developed, well nourished, and in no acute distress.  Neuro: Alert and oriented x3 HEENT: Normocephalic, atraumatic Skin: Warm and dry, no rashes. Cardiac: Regular rate and rhythm, no murmurs rubs or gallops, no lower extremity edema.  Respiratory: Clear to auscultation bilaterally. Not using accessory muscles, speaking in full sentences.  Impression and Recommendations:   1. Type 2 diabetes mellitus with complication, without long-term current use of insulin (HCC) Doing well on Lantus and glipizide with no side effects. Discussed result of HgbA1C of 7.1. Afternoon blood sugar >200, discussed increasing glipizide, initiate ozempic or try a 30 day supply of rybelsus with the potential of coming off of lantus. Patient would like to try rybelsus at this time.  - POCT HgB A1C (7.1)  2. Essential hypertension Doing well on losartan. Continue to limit salt, increase physical activity. Encouraged to keep checking blood pressure at home.  No changes to medication at this time.    Return in about 1 month (around 12/25/2021) for DM follow up. ___________________________________________ Jeanann Lewandowsky, Student NP

## 2021-12-02 DIAGNOSIS — M1712 Unilateral primary osteoarthritis, left knee: Secondary | ICD-10-CM | POA: Diagnosis not present

## 2021-12-02 DIAGNOSIS — M25562 Pain in left knee: Secondary | ICD-10-CM | POA: Diagnosis not present

## 2021-12-10 ENCOUNTER — Other Ambulatory Visit: Payer: Self-pay | Admitting: Medical-Surgical

## 2022-01-05 ENCOUNTER — Other Ambulatory Visit: Payer: Self-pay | Admitting: Medical-Surgical

## 2022-01-06 ENCOUNTER — Telehealth: Payer: Self-pay | Admitting: Neurology

## 2022-01-06 MED ORDER — GLIPIZIDE ER 10 MG PO TB24: 10.0000 mg | ORAL_TABLET | Freq: Every day | ORAL | 1 refills | Status: DC

## 2022-01-06 NOTE — Telephone Encounter (Signed)
Patient left vm wanting an increased dosage of Glipizide sent to pharmacy stating last visit this was increased from 5 mg to 10 mg and she has been doubling her dosage and running out early.  ? ?Last office note states:  ?1. Type 2 diabetes mellitus with complication, without long-term current use of insulin ([place]) ?Doing well on Lantus and glipizide with no side effects. Discussed result of HgbA1C of 7.1. Afternoon blood sugar >200, discussed increasing glipizide, initiate ozempic or try a 30 day supply of rybelsus with the potential of coming off of lantus. Patient would like to try rybelsus at this time.  ?- POCT HgB A1C (7.1) ? ? ?Rybelsus not on medication list but no documentation stating the change with Glipizide was made. Please advise?  ?

## 2022-01-06 NOTE — Telephone Encounter (Signed)
Increased dose of glipizide sent to the pharmacy.  Can you please call Jannie to verify if she completed a 30-day trial of the Rybelsus and if she tolerated it well?  If she tolerated it well, does she want to try getting this covered by insurance? ? ?___________________________________________ ?Clearnce Sorrel, DNP, APRN, FNP-BC ?Primary Care and Sports Medicine ?[place] ? ?

## 2022-01-07 ENCOUNTER — Telehealth: Payer: Self-pay | Admitting: Urology

## 2022-01-07 NOTE — Telephone Encounter (Signed)
LVM letting patient know medication sent to pharmacy and asked her to call back about her trial of Rybelsus.  ?

## 2022-01-07 NOTE — Telephone Encounter (Addendum)
DOS - 02/02/22  HAMMERTOE REPAIR 2ND RIGHT --- 42103 HALLUX MPJ FUSION RIGHT --- 12811 GRAFT RIGHT --- 20900   HTA EFFECTIVE DATE - 04/10/17  RECEIVED FAX FROM [facility] STATING THAT CPT CODES 88677, 37366 AND 81594 HAS BEEN APPROVED, [place] # [id number], GOOD FROM 02/05/22 - 05/06/22.  03/09/22 RECEIVED FAX FROM [facility] STATING THAT CPT CODES 70761, 51834 AND 37357 HAS BEEN APPROVED, [place] # [id number], GOOD FROM 04/02/22 - 07/01/22.

## 2022-01-08 NOTE — Telephone Encounter (Signed)
This encounter was created in error - please disregard.

## 2022-01-08 NOTE — Telephone Encounter (Signed)
Spoke with patient, she will start Rybelsus, work on what foods she is eating. Will let us know how she is tolerating medication in a couple weeks so we have time to work on authorization. ?

## 2022-01-08 NOTE — Telephone Encounter (Signed)
Called patient again, she never started Rybelsus, she got confused about it and just increased Glipizide. She states blood sugars at home have been around 100 or under in the morning before food but after breakfast they run 175-225 and after lunch they "go crazy".  ? ?Did you want her to go ahead and start Rybelsus now?  ?

## 2022-01-20 ENCOUNTER — Ambulatory Visit: Payer: PPO | Admitting: Sports Medicine

## 2022-01-28 ENCOUNTER — Telehealth: Payer: Self-pay

## 2022-01-28 NOTE — Telephone Encounter (Signed)
Patricia Mills called to reschedule her surgery with Dr. Sherryle Lis on 02/05/2022. She stated she doesn't have anyone to be with her the day of surgery. I have her rescheduled to 04/02/2022. Notified Dr. Sherryle Lis ?

## 2022-02-10 ENCOUNTER — Telehealth: Payer: Self-pay

## 2022-02-10 ENCOUNTER — Encounter: Payer: Self-pay | Admitting: Medical-Surgical

## 2022-02-10 MED ORDER — SEMAGLUTIDE 7 MG PO TABS: 1.0000 | ORAL_TABLET | Freq: Every day | ORAL | 0 refills | Status: AC

## 2022-02-10 NOTE — Telephone Encounter (Addendum)
Initiated Prior authorization [id number] ?Via: Called health advantage  ?[id number]:[id number] ?Status: approved as of 02/11/22 ?Reason:pa good until 10/10/22 ?Notified Pt via: Mychart ?

## 2022-02-10 NOTE — Telephone Encounter (Signed)
New order for Rybelsus 7 mg daily sent to the pharmacy.  She did a 30-day sample that we provided here for the 3 mg daily.  Please send information for prior authorization with the new information. ? ?___________________________________________ ?Clearnce Sorrel, DNP, APRN, FNP-BC ?Primary Care and Sports Medicine ?[place] ? ?

## 2022-02-11 ENCOUNTER — Encounter: Payer: PPO | Admitting: Podiatry

## 2022-02-25 ENCOUNTER — Encounter: Payer: PPO | Admitting: Podiatry

## 2022-03-05 ENCOUNTER — Encounter: Payer: Self-pay | Admitting: Neurology

## 2022-03-10 ENCOUNTER — Other Ambulatory Visit: Payer: Self-pay | Admitting: Medical-Surgical

## 2022-03-18 ENCOUNTER — Encounter: Payer: PPO | Admitting: Podiatry

## 2022-03-23 ENCOUNTER — Ambulatory Visit: Payer: PPO | Admitting: Medical-Surgical

## 2022-03-24 ENCOUNTER — Encounter: Payer: Self-pay | Admitting: Medical-Surgical

## 2022-03-24 ENCOUNTER — Ambulatory Visit (INDEPENDENT_AMBULATORY_CARE_PROVIDER_SITE_OTHER): Payer: PPO | Admitting: Medical-Surgical

## 2022-03-24 VITALS — BP 120/69 | HR 64 | Temp 98.6°F | Resp 20 | Ht 68.5 in | Wt 206.5 lb

## 2022-03-24 DIAGNOSIS — E039 Hypothyroidism, unspecified: Secondary | ICD-10-CM

## 2022-03-24 DIAGNOSIS — E118 Type 2 diabetes mellitus with unspecified complications: Secondary | ICD-10-CM | POA: Diagnosis not present

## 2022-03-24 DIAGNOSIS — Z01818 Encounter for other preprocedural examination: Secondary | ICD-10-CM

## 2022-03-24 DIAGNOSIS — I10 Essential (primary) hypertension: Secondary | ICD-10-CM

## 2022-03-24 DIAGNOSIS — E782 Mixed hyperlipidemia: Secondary | ICD-10-CM

## 2022-03-24 LAB — POCT URINALYSIS DIP (CLINITEK)
Bilirubin, UA: NEGATIVE
Blood, UA: NEGATIVE
Glucose, UA: 100 mg/dL — AB
Ketones, POC UA: NEGATIVE mg/dL
Leukocytes, UA: NEGATIVE
Nitrite, UA: NEGATIVE
POC PROTEIN,UA: NEGATIVE
Spec Grav, UA: >=1.03 — AB (ref 1.010–1.025)
Urobilinogen, UA: 0.2 E.U./dL
pH, UA: 5.5 (ref 5.0–8.0)

## 2022-03-24 LAB — POCT GLYCOSYLATED HEMOGLOBIN (HGB A1C): HbA1c, POC (controlled diabetic range): 6.9 % (ref 0.0–7.0)

## 2022-03-24 LAB — POCT UA - MICROALBUMIN
Albumin/Creatinine Ratio, Urine, POC: <30
Creatinine, POC: 200 mg/dL
Microalbumin Ur, POC: 10 mg/L

## 2022-03-24 MED ORDER — SUCRALFATE 1 G PO TABS: 1.0000 g | ORAL_TABLET | Freq: Three times a day (TID) | ORAL | 0 refills | Status: AC

## 2022-03-24 NOTE — Progress Notes (Signed)
Established Patient Office Visit  Subjective   Patient ID: Patricia Mills, female   DOB: 1945/10/13 Age: 76 y.o. MRN: [id number]   Chief Complaint  Patient presents with   Follow-up    HPI Pleasant 76 year old female presenting today for preoperative clearance and follow-up on:  Diabetes: Last hemoglobin A1c 7.1%.  Taking glipizide ER 10 mg daily, tolerating well without side effects.  Also taking 15 units of Lantus once daily with no concerns.  Tried taking Rybelsus but this caused significant GI upset so she stopped the medication.  Hypertension: Taking losartan 50 mg daily, tolerating well without side effects.  Following a low-sodium diet and working to stay as physically active as possible. Denies CP, SOB, palpitations, lower extremity edema, dizziness, headaches, or vision changes.  Hyperlipidemia: Not currently taking a prescribed medication for cholesterol.  Due for lipid recheck.  Hypothyroidism: Not currently taking medication.  Last thyroid recheck was February 2022 with normal results.  No concerning symptoms related to thyroid dysfunction.  Preoperative clearance: She will be having foot surgery next week to repair a hammertoe as well as a bunion.  Is requiring clearance from [facility] for the procedure to be completed.  Review of Systems  Constitutional: Negative.   HENT:  Positive for congestion. Negative for ear discharge, ear pain, hearing loss, nosebleeds, sinus pain, sore throat and tinnitus.   Eyes:  Negative for blurred vision, double vision and photophobia.  Respiratory:  Positive for cough. Negative for hemoptysis, sputum production, shortness of breath, wheezing and stridor.   Cardiovascular:  Positive for leg swelling (chronic right leg edema). Negative for chest pain, palpitations, orthopnea, claudication and PND.  Gastrointestinal:  Positive for heartburn. Negative for abdominal pain, blood in stool, constipation, diarrhea, melena, nausea  and vomiting.  Genitourinary: Negative.   Musculoskeletal: Negative.   Skin: Negative.   Neurological:  Negative for dizziness, tingling, seizures, weakness and headaches.  Endo/Heme/Allergies:  Negative for environmental allergies and polydipsia. Does not bruise/bleed easily.  Psychiatric/Behavioral:  Negative for depression and suicidal ideas. The patient has insomnia (delayed sleep onset). The patient is not nervous/anxious.     Past Medical History:  Diagnosis Date   Depression    Diabetes mellitus without complication ([facility])    Diverticulitis    History of colon polyps    Hypertension    Thyroid disease    Past Surgical History:  Procedure Laterality Date   ABDOMINAL HYSTERECTOMY     CHOLECYSTECTOMY     TONSILLECTOMY     Family History  Problem Relation Age of Onset   Stroke Mother    Colon cancer Other    Breast cancer Other    Social History   Socioeconomic History   Marital status: Divorced    Spouse name: Not on file   Number of children: Not on file   Years of education: Not on file   Highest education level: Not on file  Occupational History   Not on file  Tobacco Use   Smoking status: Every Day    Packs/day: 0.25    Years: 20.00    Total pack years: 5.00    Types: Cigarettes   Smokeless tobacco: Never  Vaping Use   Vaping Use: Never used  Substance and Sexual Activity   Alcohol use: Not Currently   Drug use: Never   Sexual activity: Not Currently  Other Topics Concern   Not on file  Social History Narrative   Not on file   Social Determinants  of Health   Financial Resource Strain: Not on file  Food Insecurity: Not on file  Transportation Needs: Not on file  Physical Activity: Not on file  Stress: Not on file  Social Connections: Not on file   Allergies  Allergen Reactions   Metformin Diarrhea   Penicillins Itching and Rash   Allergies as of 03/24/2022       Reactions   Metformin Diarrhea   Penicillins Itching, Rash         Medication List        Accurate as of March 24, 2022  2:17 PM. If you have any questions, ask your nurse or doctor.          STOP taking these medications    acetaminophen 500 MG tablet Commonly known as: TYLENOL Stopped by: Samuel Bouche, NP   methylPREDNISolone 4 MG Tbpk tablet Commonly known as: MEDROL DOSEPAK Stopped by: Samuel Bouche, NP   predniSONE 5 MG tablet Commonly known as: DELTASONE Stopped by: Samuel Bouche, NP       TAKE these medications    albuterol 108 (90 Base) MCG/ACT inhaler Commonly known as: VENTOLIN HFA Inhale 2 puffs into the lungs every 6 (six) hours as needed for wheezing.   ALPRAZolam 0.5 MG tablet Commonly known as: XANAX Take 1 tablet (0.5 mg total) by mouth daily as needed for anxiety. Use VERY SPARINGLY to prevent dependence and tolerance. Aim for no more than 2-3 doses per week.   aspirin 81 MG chewable tablet Chew 81 mg by mouth daily.   calcium carbonate 600 MG Tabs tablet Commonly known as: OS-CAL Take 600 mg by mouth daily.   celecoxib 200 MG capsule Commonly known as: CELEBREX Take 200 mg by mouth daily.   escitalopram 10 MG tablet Commonly known as: LEXAPRO Take 1 tablet by mouth once daily   glipiZIDE 10 MG 24 hr tablet Commonly known as: GLUCOTROL XL Take 1 tablet (10 mg total) by mouth daily.   insulin glargine-yfgn 100 UNIT/ML Pen Commonly known as: SEMGLEE INJECT 15 UNITS SUBCUTANEOUSLY AT BEDTIME   loperamide 2 MG capsule Commonly known as: IMODIUM Take 2 mg by mouth daily as needed for diarrhea or loose stools.   loratadine 10 MG tablet Commonly known as: CLARITIN Take 10 mg by mouth daily as needed.   losartan 50 MG tablet Commonly known as: COZAAR Take 1 tablet by mouth once daily   OneTouch Delica Plus [id number] Misc USE 1  TO CHECK GLUCOSE 4 TIMES DAILY   OneTouch Ultra test strip Generic drug: glucose blood USE 1 STRIP TO CHECK GLUCOSE TWICE DAILY   sucralfate 1 g tablet Commonly known as:  Carafate Take 1 tablet (1 g total) by mouth 4 (four) times daily -  with meals and at bedtime. Started by: Samuel Bouche, NP   ZOLOFT PO sertraline        Objective:    Vitals:   03/24/22 1144  BP: 120/69  Pulse: 64  Temp: 98.6 F (37 C)  Resp: 20  Height: 5' 8.5" (1.74 m)  Weight: 206 lb 8 oz (93.7 kg)  SpO2: 95%  BMI (Calculated): 30.94   Physical Exam Vitals reviewed.  Constitutional:      General: She is not in acute distress.    Appearance: Normal appearance. She is obese. She is not ill-appearing.  HENT:     Head: Normocephalic and atraumatic.     Right Ear: Tympanic membrane, ear canal and external ear normal. There is no impacted cerumen.  Left Ear: Tympanic membrane, ear canal and external ear normal. There is no impacted cerumen.     Nose: Nose normal.     Mouth/Throat:     Mouth: Mucous membranes are moist.     Pharynx: No oropharyngeal exudate or posterior oropharyngeal erythema.  Eyes:     General: No scleral icterus.       Right eye: No discharge.        Left eye: No discharge.     Extraocular Movements: Extraocular movements intact.     Conjunctiva/sclera: Conjunctivae normal.     Pupils: Pupils are equal, round, and reactive to light.  Neck:     Vascular: No carotid bruit.  Cardiovascular:     Rate and Rhythm: Normal rate and regular rhythm.     Pulses: Normal pulses.     Heart sounds: Normal heart sounds.  Pulmonary:     Effort: Pulmonary effort is normal. No respiratory distress.     Breath sounds: Normal breath sounds.  Abdominal:     General: Bowel sounds are normal. There is no distension.     Palpations: Abdomen is soft. There is no mass.     Tenderness: There is no abdominal tenderness. There is no guarding or rebound.     Hernia: No hernia is present.  Musculoskeletal:        General: Normal range of motion.     Cervical back: Normal range of motion and neck supple. No tenderness.  Lymphadenopathy:     Cervical: No cervical  adenopathy.  Skin:    General: Skin is warm and dry.  Neurological:     Mental Status: She is alert and oriented to person, place, and time.     Cranial Nerves: No cranial nerve deficit.     Gait: Gait normal.  Psychiatric:        Mood and Affect: Mood normal.        Behavior: Behavior normal.        Thought Content: Thought content normal.        Judgment: Judgment normal.    Results for orders placed or performed in visit on 03/24/22 (from the past 24 hour(s))  POCT HgB A1C     Status: None   Collection Time: 03/24/22 11:59 AM  Result Value Ref Range   Hemoglobin A1C     HbA1c POC (<> result, manual entry)     HbA1c, POC (prediabetic range)     HbA1c, POC (controlled diabetic range) 6.9 0.0 - 7.0 %  POCT URINALYSIS DIP (CLINITEK)     Status: Abnormal   Collection Time: 03/24/22 12:32 PM  Result Value Ref Range   Color, UA yellow yellow   Clarity, UA clear clear   Glucose, UA =100 (A) negative mg/dL   Bilirubin, UA negative negative   Ketones, POC UA negative negative mg/dL   Spec Grav, UA >=1.030 (A) 1.010 - 1.025   Blood, UA negative negative   pH, UA 5.5 5.0 - 8.0   POC PROTEIN,UA negative negative, trace   Urobilinogen, UA 0.2 0.2 or 1.0 E.U./dL   Nitrite, UA Negative Negative   Leukocytes, UA Negative Negative  POCT UA - Microalbumin     Status: Normal   Collection Time: 03/24/22 12:34 PM  Result Value Ref Range   Microalbumin Ur, POC 10 mg/L   Creatinine, POC 200 mg/dL   Albumin/Creatinine Ratio, Urine, POC <30        The 10-year ASCVD risk score (Arnett DK, et al., 2019)  is: 41.9%   Values used to calculate the score:     Age: 49 years     Sex: Female     Is Non-Hispanic African American: No     Diabetic: Yes     Tobacco smoker: Yes     Systolic Blood Pressure: 163 mmHg     Is BP treated: Yes     HDL Cholesterol: 58 MG/DL     Total Cholesterol: 213 MG/DL   Assessment & Plan:   1. Preoperative clearance EKG completed in office with result of sinus  bradycardia, rate 56, normal axis, no red flags.  Checking labs as below.  Form obtained from patient and will be completed and faxed as requested.  Overall her chronic diseases are well controlled and she is at a low risk for the low risk procedure that is planned. - EKG 12-Lead - COMPLETE METABOLIC PANEL WITH GFR - CP4508-PT/INR AND PTT  2. Type 2 diabetes mellitus with complication, without long-term current use of insulin (HCC) POCT hemoglobin A1c 6.9% today.  Microalbumin normal.  Checking CMP.  POCT urinalysis with glucose and elevated specific gravity however otherwise normal.  Continue glipizide ER 10 mg daily and Lantus 15 units once daily as prescribed.  Monitor blood sugars with a fasting goal of 120 or less. Sending Sucralfate for 1 month to help with GI irritation/GERD still lingering after rybelsus. - COMPLETE METABOLIC PANEL WITH GFR - POCT UA - Microalbumin - POCT HgB A1C - POCT URINALYSIS DIP (CLINITEK)  3. Essential hypertension Checking labs as below.  Blood pressure at goal today.  Continue losartan 50 mg daily. - CBC with Differential - COMPLETE METABOLIC PANEL WITH GFR - Lipid panel  4. Hypothyroidism, unspecified type Checking TSH today. - TSH  5. Mixed hyperlipidemia Checking lipid panel today. - Lipid panel  Return in about 6 months (around 09/23/2022) for chronic disease follow up. ___________________________________________ Clearnce Sorrel, DNP, APRN, FNP-BC [facility] [place]

## 2022-03-25 LAB — COMPLETE METABOLIC PANEL WITH GFR
AG Ratio: 1.5 (calc) (ref 1.0–2.5)
ALT: 10 U/L (ref 6–29)
AST: 15 U/L (ref 10–35)
Albumin: 3.8 g/dL (ref 3.6–5.1)
Alkaline phosphatase (APISO): 51 U/L (ref 37–153)
BUN: 16 mg/dL (ref 7–25)
CO2: 26 mmol/L (ref 20–32)
Calcium: 9.3 mg/dL (ref 8.6–10.4)
Chloride: 106 mmol/L (ref 98–110)
Creat: 0.79 mg/dL (ref 0.60–1.00)
Globulin: 2.5 g/dL (calc) (ref 1.9–3.7)
Glucose, Bld: 132 mg/dL — ABNORMAL HIGH (ref 65–99)
Potassium: 4.2 mmol/L (ref 3.5–5.3)
Sodium: 140 mmol/L (ref 135–146)
Total Bilirubin: 0.5 mg/dL (ref 0.2–1.2)
Total Protein: 6.3 g/dL (ref 6.1–8.1)
eGFR: 78 mL/min/{1.73_m2} (ref >=60)

## 2022-03-25 LAB — CBC WITH DIFFERENTIAL/PLATELET
Absolute Monocytes: 598 cells/uL (ref 200–950)
Basophils Absolute: 39 cells/uL (ref 0–200)
Basophils Relative: 0.4 %
Eosinophils Absolute: 137 cells/uL (ref 15–500)
Eosinophils Relative: 1.4 %
HCT: 38.8 % (ref 35.0–45.0)
Hemoglobin: 13.1 g/dL (ref 11.7–15.5)
Lymphs Abs: 3499 cells/uL (ref 850–3900)
MCH: 31.7 pg (ref 27.0–33.0)
MCHC: 33.8 g/dL (ref 32.0–36.0)
MCV: 93.9 fL (ref 80.0–100.0)
MPV: 10.2 fL (ref 7.5–12.5)
Monocytes Relative: 6.1 %
Neutro Abs: 5527 cells/uL (ref 1500–7800)
Neutrophils Relative %: 56.4 %
Platelets: 239 10*3/uL (ref 140–400)
RBC: 4.13 10*6/uL (ref 3.80–5.10)
RDW: 12 % (ref 11.0–15.0)
Total Lymphocyte: 35.7 %
WBC: 9.8 10*3/uL (ref 3.8–10.8)

## 2022-03-25 LAB — TSH: TSH: 1.98 mIU/L (ref 0.40–4.50)

## 2022-03-25 LAB — CP4508-PT/INR AND PTT
INR: 1
Prothrombin Time: 10.5 s (ref 9.0–11.5)
aPTT: 29 s (ref 23–32)

## 2022-03-25 LAB — LIPID PANEL
Cholesterol: 210 mg/dL — ABNORMAL HIGH (ref <200)
HDL: 43 mg/dL — ABNORMAL LOW (ref >=50)
LDL Cholesterol (Calc): 144 mg/dL (calc) — ABNORMAL HIGH
Non-HDL Cholesterol (Calc): 167 mg/dL (calc) — ABNORMAL HIGH (ref <130)
Total CHOL/HDL Ratio: 4.9 (calc) (ref <5.0)
Triglycerides: 115 mg/dL (ref <150)

## 2022-03-25 MED ORDER — ROSUVASTATIN CALCIUM 10 MG PO TABS: 10.0000 mg | ORAL_TABLET | Freq: Every day | ORAL | 3 refills | Status: DC

## 2022-03-25 NOTE — Addendum Note (Signed)
Addended bySamuel Bouche on: 03/25/2022 12:25 PM   Modules accepted: Orders

## 2022-03-29 ENCOUNTER — Telehealth: Payer: Self-pay

## 2022-03-29 NOTE — Telephone Encounter (Signed)
I printed last office visit notes and lab results and faxed them with the forms to [facility].  Fax number: [phone]  Fax confirmation successful.

## 2022-03-31 ENCOUNTER — Other Ambulatory Visit: Payer: Self-pay

## 2022-03-31 ENCOUNTER — Encounter (HOSPITAL_BASED_OUTPATIENT_CLINIC_OR_DEPARTMENT_OTHER): Payer: Self-pay | Admitting: Podiatry

## 2022-03-31 MED ORDER — LOSARTAN POTASSIUM 50 MG PO TABS: 50.0000 mg | ORAL_TABLET | Freq: Every day | ORAL | 1 refills | Status: AC

## 2022-03-31 MED ORDER — LANTUS SOLOSTAR 100 UNIT/ML ~~LOC~~ SOPN: 15.0000 [IU] | PEN_INJECTOR | Freq: Every day | SUBCUTANEOUS | 1 refills | Status: AC

## 2022-03-31 NOTE — Progress Notes (Addendum)
Spoke w/ via phone for pre-op interview---pt Lab needs dos---- none  , per anesthesia, surgery orders requested dt [facility] epic ib            Lab results------ekg 03-24-2022 chart/epic, cbc, cmet 03-24-2022 epic COVID test -----patient states asymptomatic no test needed Arrive at -------800 am 04-02-2022 NPO after MN NO Solid Food.  Clear liquids from MN until---700 am Med rec completed Medications to take morning of surgery -----albuterol inhaler prn/bring inhaler, alprazolam prn, escitalopram Diabetic medication -----take 1/2 dose of hs lantus insulin night before, surgery, no diabetic medications am of surgery Patient instructed no nail polish to be worn day of surgery Patient instructed to bring photo id and insurance card day of surgery Patient aware to have Driver (ride ) / caregiver    daughter amy Cabler for 24 hours after surgery  Patient Special Instructions -----no smoking 24 hours before surgery Pre-Op special Istructions -----none Patient verbalized understanding of instructions that were given at this phone interview. Patient denies shortness of breath, chest pain, fever, cough at this phone interview.   H & P /medical clearance joy jessup np dated 03-24-2022 on chart for 04-02-2022 surgery

## 2022-03-31 NOTE — Telephone Encounter (Signed)
Can we call and confirm how many units at night patient is taking of insulin?

## 2022-04-02 ENCOUNTER — Other Ambulatory Visit: Payer: Self-pay

## 2022-04-02 ENCOUNTER — Ambulatory Visit (HOSPITAL_BASED_OUTPATIENT_CLINIC_OR_DEPARTMENT_OTHER): Payer: PPO

## 2022-04-02 ENCOUNTER — Ambulatory Visit (HOSPITAL_BASED_OUTPATIENT_CLINIC_OR_DEPARTMENT_OTHER)
Admission: RE | Admit: 2022-04-02 | Discharge: 2022-04-02 | Disposition: A | Discharge status: Home / Self Care | Payer: PPO | Source: Ambulatory Visit | Attending: Podiatry | Admitting: Podiatry

## 2022-04-02 ENCOUNTER — Encounter (HOSPITAL_BASED_OUTPATIENT_CLINIC_OR_DEPARTMENT_OTHER): Payer: Self-pay | Admitting: Podiatry

## 2022-04-02 ENCOUNTER — Ambulatory Visit (HOSPITAL_BASED_OUTPATIENT_CLINIC_OR_DEPARTMENT_OTHER): Payer: PPO | Admitting: Anesthesiology

## 2022-04-02 ENCOUNTER — Encounter (HOSPITAL_BASED_OUTPATIENT_CLINIC_OR_DEPARTMENT_OTHER)
Admission: RE | Disposition: A | Discharge status: Home / Self Care | Payer: Self-pay | Source: Ambulatory Visit | Attending: Podiatry

## 2022-04-02 DIAGNOSIS — M2041 Other hammer toe(s) (acquired), right foot: Secondary | ICD-10-CM | POA: Diagnosis not present

## 2022-04-02 DIAGNOSIS — Z794 Long term (current) use of insulin: Secondary | ICD-10-CM | POA: Insufficient documentation

## 2022-04-02 DIAGNOSIS — M24574 Contracture, right foot: Secondary | ICD-10-CM | POA: Diagnosis not present

## 2022-04-02 DIAGNOSIS — M19071 Primary osteoarthritis, right ankle and foot: Secondary | ICD-10-CM

## 2022-04-02 DIAGNOSIS — F17200 Nicotine dependence, unspecified, uncomplicated: Secondary | ICD-10-CM | POA: Insufficient documentation

## 2022-04-02 DIAGNOSIS — M2011 Hallux valgus (acquired), right foot: Secondary | ICD-10-CM | POA: Diagnosis not present

## 2022-04-02 DIAGNOSIS — Z79899 Other long term (current) drug therapy: Secondary | ICD-10-CM | POA: Diagnosis not present

## 2022-04-02 DIAGNOSIS — E785 Hyperlipidemia, unspecified: Secondary | ICD-10-CM | POA: Diagnosis not present

## 2022-04-02 DIAGNOSIS — M21611 Bunion of right foot: Secondary | ICD-10-CM | POA: Insufficient documentation

## 2022-04-02 DIAGNOSIS — E119 Type 2 diabetes mellitus without complications: Secondary | ICD-10-CM | POA: Insufficient documentation

## 2022-04-02 DIAGNOSIS — I10 Essential (primary) hypertension: Secondary | ICD-10-CM | POA: Insufficient documentation

## 2022-04-02 DIAGNOSIS — E039 Hypothyroidism, unspecified: Secondary | ICD-10-CM | POA: Diagnosis not present

## 2022-04-02 DIAGNOSIS — F17210 Nicotine dependence, cigarettes, uncomplicated: Secondary | ICD-10-CM

## 2022-04-02 DIAGNOSIS — J45909 Unspecified asthma, uncomplicated: Secondary | ICD-10-CM | POA: Insufficient documentation

## 2022-04-02 HISTORY — PX: HALLUX FUSION: SHX6621

## 2022-04-02 HISTORY — DX: Personal history of urinary calculi: Z87.442

## 2022-04-02 HISTORY — DX: Unspecified osteoarthritis, unspecified site: M19.90

## 2022-04-02 HISTORY — PX: HAMMER TOE SURGERY: SHX385

## 2022-04-02 HISTORY — DX: Presence of spectacles and contact lenses: Z97.3

## 2022-04-02 HISTORY — DX: Cardiac murmur, unspecified: R01.1

## 2022-04-02 LAB — GLUCOSE, CAPILLARY
Glucose-Capillary: 138 mg/dL — ABNORMAL HIGH (ref 70–99)
Glucose-Capillary: 181 mg/dL — ABNORMAL HIGH (ref 70–99)

## 2022-04-02 SURGERY — FUSION, JOINT, GREAT TOE
Anesthesia: Monitor Anesthesia Care | Site: Foot | Laterality: Right

## 2022-04-02 MED ORDER — ONDANSETRON HCL 4 MG/2ML IJ SOLN
INTRAMUSCULAR | Status: AC
  Filled 2022-04-02: qty 2

## 2022-04-02 MED ORDER — MIDAZOLAM HCL 2 MG/2ML IJ SOLN
INTRAMUSCULAR | Status: AC
  Filled 2022-04-02: qty 2

## 2022-04-02 MED ORDER — OXYCODONE HCL 5 MG PO TABS: 5.0000 mg | ORAL_TABLET | ORAL | 0 refills | Status: AC | PRN

## 2022-04-02 MED ORDER — FENTANYL CITRATE (PF) 100 MCG/2ML IJ SOLN
INTRAMUSCULAR | Status: AC
  Filled 2022-04-02: qty 2

## 2022-04-02 MED ORDER — ACETAMINOPHEN 325 MG PO TABS: 325.0000 mg | ORAL_TABLET | ORAL | Status: DC | PRN

## 2022-04-02 MED ORDER — EPHEDRINE 5 MG/ML INJ
INTRAVENOUS | Status: AC
  Filled 2022-04-02: qty 5

## 2022-04-02 MED ORDER — LACTATED RINGERS IV SOLN: INTRAVENOUS | Status: DC

## 2022-04-02 MED ORDER — ONDANSETRON HCL 4 MG/2ML IJ SOLN: 4.0000 mg | Freq: Once | INTRAMUSCULAR | Status: DC | PRN

## 2022-04-02 MED ORDER — OXYCODONE HCL 5 MG/5ML PO SOLN: 5.0000 mg | Freq: Once | ORAL | Status: DC | PRN

## 2022-04-02 MED ORDER — FENTANYL CITRATE (PF) 100 MCG/2ML IJ SOLN
50.0000 ug | Freq: Once | INTRAMUSCULAR | Status: AC
  Administered 2022-04-02: 50 ug via INTRAVENOUS

## 2022-04-02 MED ORDER — FENTANYL CITRATE (PF) 100 MCG/2ML IJ SOLN
INTRAMUSCULAR | Status: DC | PRN
  Administered 2022-04-02: 50 ug via INTRAVENOUS
  Administered 2022-04-02 (×2): 25 ug via INTRAVENOUS

## 2022-04-02 MED ORDER — BUPIVACAINE HCL (PF) 0.5 % IJ SOLN
INTRAMUSCULAR | Status: DC | PRN
  Administered 2022-04-02: 10 mL

## 2022-04-02 MED ORDER — MIDAZOLAM HCL 2 MG/2ML IJ SOLN
1.0000 mg | Freq: Once | INTRAMUSCULAR | Status: AC
  Administered 2022-04-02: 1 mg via INTRAVENOUS

## 2022-04-02 MED ORDER — EPHEDRINE SULFATE-NACL 50-0.9 MG/10ML-% IV SOSY
PREFILLED_SYRINGE | INTRAVENOUS | Status: DC | PRN
  Administered 2022-04-02: 5 mg via INTRAVENOUS
  Administered 2022-04-02 (×2): 10 mg via INTRAVENOUS

## 2022-04-02 MED ORDER — GABAPENTIN 300 MG PO CAPS: 300.0000 mg | ORAL_CAPSULE | Freq: Three times a day (TID) | ORAL | 0 refills | Status: AC

## 2022-04-02 MED ORDER — PROPOFOL 500 MG/50ML IV EMUL
INTRAVENOUS | Status: DC | PRN
  Administered 2022-04-02: 200 ug/kg/min via INTRAVENOUS

## 2022-04-02 MED ORDER — ACETAMINOPHEN 500 MG PO TABS: 1000.0000 mg | ORAL_TABLET | Freq: Four times a day (QID) | ORAL | 0 refills | Status: AC | PRN

## 2022-04-02 MED ORDER — PROPOFOL 10 MG/ML IV BOLUS
INTRAVENOUS | Status: DC | PRN
  Administered 2022-04-02: 20 mg via INTRAVENOUS

## 2022-04-02 MED ORDER — PHENYLEPHRINE 80 MCG/ML (10ML) SYRINGE FOR IV PUSH (FOR BLOOD PRESSURE SUPPORT)
PREFILLED_SYRINGE | INTRAVENOUS | Status: AC
  Filled 2022-04-02: qty 10

## 2022-04-02 MED ORDER — VANCOMYCIN HCL IN DEXTROSE 1-5 GM/200ML-% IV SOLN
INTRAVENOUS | Status: AC
  Filled 2022-04-02: qty 200

## 2022-04-02 MED ORDER — VANCOMYCIN HCL IN DEXTROSE 1-5 GM/200ML-% IV SOLN
1000.0000 mg | INTRAVENOUS | Status: AC
Start: 2022-04-02 — End: 2022-04-02
  Administered 2022-04-02: 1000 mg via INTRAVENOUS

## 2022-04-02 MED ORDER — FENTANYL CITRATE (PF) 100 MCG/2ML IJ SOLN: 25.0000 ug | INTRAMUSCULAR | Status: DC | PRN

## 2022-04-02 MED ORDER — PHENYLEPHRINE HCL (PRESSORS) 10 MG/ML IV SOLN
INTRAVENOUS | Status: DC | PRN
  Administered 2022-04-02 (×3): 80 ug via INTRAVENOUS

## 2022-04-02 MED ORDER — ONDANSETRON HCL 4 MG/2ML IJ SOLN
INTRAMUSCULAR | Status: DC | PRN
  Administered 2022-04-02: 4 mg via INTRAVENOUS

## 2022-04-02 MED ORDER — PROPOFOL 1000 MG/100ML IV EMUL
INTRAVENOUS | Status: AC
  Filled 2022-04-02: qty 100

## 2022-04-02 MED ORDER — ACETAMINOPHEN 160 MG/5ML PO SOLN: 325.0000 mg | ORAL | Status: DC | PRN

## 2022-04-02 MED ORDER — ASPIRIN 325 MG PO TBEC: 325.0000 mg | DELAYED_RELEASE_TABLET | Freq: Two times a day (BID) | ORAL | 0 refills | Status: AC

## 2022-04-02 MED ORDER — 0.9 % SODIUM CHLORIDE (POUR BTL) OPTIME
TOPICAL | Status: DC | PRN
  Administered 2022-04-02: 500 mL

## 2022-04-02 MED ORDER — MIDAZOLAM HCL 5 MG/5ML IJ SOLN
INTRAMUSCULAR | Status: DC | PRN
  Administered 2022-04-02: .5 mg via INTRAVENOUS
  Administered 2022-04-02: 1 mg via INTRAVENOUS
  Administered 2022-04-02: .5 mg via INTRAVENOUS

## 2022-04-02 MED ORDER — OXYCODONE HCL 5 MG PO TABS: 5.0000 mg | ORAL_TABLET | Freq: Once | ORAL | Status: DC | PRN

## 2022-04-02 MED ORDER — ROPIVACAINE HCL 5 MG/ML IJ SOLN
INTRAMUSCULAR | Status: DC | PRN
  Administered 2022-04-02 (×4): 5 mL via PERINEURAL

## 2022-04-02 SURGICAL SUPPLY — 86 items
2.5 drill bit ×1 IMPLANT
APL PRP STRL LF DISP 70% ISPRP (MISCELLANEOUS) ×1
BANDAGE ESMARK 6X9 LF (GAUZE/BANDAGES/DRESSINGS) ×2 IMPLANT
BIT DRILL 2.0MM (BIT) IMPLANT
BIT DRILL 2.1 (BIT) IMPLANT
BIT DRILL 2.5X3.5XSCR (BIT) IMPLANT
BIT DRILL CANN AO ASNIS 2.1 (DRILL) IMPLANT
BIT DRL 2.5X3.5XSCR (BIT) ×1
BLADE AVERAGE 25X9 (BLADE) IMPLANT
BLADE MINI RND TIP GREEN BEAV (BLADE) ×3 IMPLANT
BLADE OSC/SAG .038X5.5 CUT EDG (BLADE) IMPLANT
BLADE SURG 15 STRL LF DISP TIS (BLADE) ×2 IMPLANT
BLADE SURG 15 STRL SS (BLADE) ×2
BNDG CMPR 9X6 STRL LF SNTH (GAUZE/BANDAGES/DRESSINGS) ×1
BNDG ELASTIC 4X5.8 VLCR STR LF (GAUZE/BANDAGES/DRESSINGS) ×3 IMPLANT
BNDG ESMARK 6X9 LF (GAUZE/BANDAGES/DRESSINGS) ×2
BNDG GAUZE ELAST 4 BULKY (GAUZE/BANDAGES/DRESSINGS) ×3 IMPLANT
CHLORAPREP W/TINT 26 (MISCELLANEOUS) ×3 IMPLANT
COVER BACK TABLE 60X90IN (DRAPES) ×3 IMPLANT
CUFF TOURN SGL QUICK 18X4 (TOURNIQUET CUFF) IMPLANT
DECANTER SPIKE VIAL GLASS SM (MISCELLANEOUS) IMPLANT
DRAPE 3/4 80X56 (DRAPES) ×3 IMPLANT
DRAPE C-ARM 35X43 STRL (DRAPES) ×3 IMPLANT
DRAPE EXTREMITY T 121X128X90 (DISPOSABLE) ×3 IMPLANT
DRAPE OEC MINIVIEW 54X84 (DRAPES) ×3 IMPLANT
DRAPE U-SHAPE 47X51 STRL (DRAPES) ×3 IMPLANT
DRILL BIT 2.0MM (BIT) ×2
DRILL BIT 2.1 (BIT) ×2
DRILL BIT 2.5MM (BIT) ×2
DRILL CANN AO ASNIS 2.1 (DRILL) ×2
ELECT REM PT RETURN 9FT ADLT (ELECTROSURGICAL) ×2
ELECTRODE REM PT RTRN 9FT ADLT (ELECTROSURGICAL) ×2 IMPLANT
GAUZE 4X4 16PLY ~~LOC~~+RFID DBL (SPONGE) ×3 IMPLANT
GAUZE SPONGE 4X4 12PLY STRL (GAUZE/BANDAGES/DRESSINGS) ×3 IMPLANT
GAUZE XEROFORM 1X8 LF (GAUZE/BANDAGES/DRESSINGS) ×3 IMPLANT
GLOVE BIO SURGEON STRL SZ7 (GLOVE) ×3 IMPLANT
GLOVE BIOGEL PI IND STRL 7.5 (GLOVE) ×2 IMPLANT
GLOVE BIOGEL PI INDICATOR 7.5 (GLOVE) ×1
GOWN STRL REUS W/TWL LRG LVL3 (GOWN DISPOSABLE) ×3 IMPLANT
K-WIRE 1.2X100 (WIRE) ×2
K-WIRE 1.6X100 (WIRE) ×2
K-WIRE CAPS STERILE WHITE .045 (WIRE) ×3 IMPLANT
K-WIRE DBL END TROCAR 6X.045 (WIRE) ×2
K-WIRE DBL END TROCAR 6X.062 (WIRE) ×4
K-WIRE DBL TROCAR .045X4 (WIRE) ×2
K-WIRE FX100X1.6XSMTH TROC (WIRE) ×1
K-WIRE OLIVE 1.2X65 (WIRE) ×4
KIT TURNOVER CYSTO (KITS) ×3 IMPLANT
KWIRE 1.2X100 (WIRE) IMPLANT
KWIRE DBL END TROCAR 6X.045 (WIRE) ×2 IMPLANT
KWIRE DBL END TROCAR 6X.062 (WIRE) IMPLANT
KWIRE DBL TROCAR .045X4 (WIRE) ×2 IMPLANT
KWIRE FX100X1.6XSMTH TROC (WIRE) IMPLANT
KWIRE OLIVE 1.2X65 (WIRE) IMPLANT
NDL HYPO 25X1 1.5 SAFETY (NEEDLE) IMPLANT
NDL SAFETY ECLIPSE 18X1.5 (NEEDLE) IMPLANT
NEEDLE HYPO 18GX1.5 SHARP (NEEDLE)
NEEDLE HYPO 25X1 1.5 SAFETY (NEEDLE) ×2 IMPLANT
NS IRRIG 1000ML POUR BTL (IV SOLUTION) ×3 IMPLANT
PACK BASIN DAY SURGERY FS (CUSTOM PROCEDURE TRAY) ×3 IMPLANT
PENCIL SMOKE EVACUATOR (MISCELLANEOUS) ×3 IMPLANT
PIN CAPS ORTHO GREEN .062 (PIN) ×1 IMPLANT
PLATE HALLUX MTP 5H RT (Plate) ×1 IMPLANT
SCREW BONE CANN 3X38 NS (Screw) ×1 IMPLANT
SCREW LOCK 3X16MM (Screw) ×1 IMPLANT
SCREW LOCK T8 16X3.5X STRDR (Screw) IMPLANT
SCREW LOCK T8 18X3.5X STRDR (Screw) IMPLANT
SCREW LOCKING 3.5X16MM (Screw) ×2 IMPLANT
SCREW LOCKING 3.5X18MM (Screw) ×2 IMPLANT
SCREW NON LOCKING 3.5X22MM (Screw) ×1 IMPLANT
SCREW NONLOCK 3.0X14 (Screw) ×1 IMPLANT
STOCKINETTE 6  STRL (DRAPES) ×2
STOCKINETTE 6 STRL (DRAPES) IMPLANT
SUCTION FRAZIER HANDLE 10FR (MISCELLANEOUS) ×2
SUCTION TUBE FRAZIER 10FR DISP (MISCELLANEOUS) ×2 IMPLANT
SUT ETHILON 4 0 PS 2 18 (SUTURE) ×1 IMPLANT
SUT MNCRL AB 3-0 PS2 18 (SUTURE) ×1 IMPLANT
SUT MNCRL AB 4-0 PS2 18 (SUTURE) IMPLANT
SUT VIC AB 2-0 SH 27 (SUTURE)
SUT VIC AB 2-0 SH 27XBRD (SUTURE) IMPLANT
SYR 20ML LL LF (SYRINGE) IMPLANT
SYR BULB EAR ULCER 3OZ GRN STR (SYRINGE) ×3 IMPLANT
SYR CONTROL 10ML LL (SYRINGE) ×1 IMPLANT
TRAY DSU PREP LF (CUSTOM PROCEDURE TRAY) IMPLANT
TUBE CONNECTING 12X1/4 (SUCTIONS) ×3 IMPLANT
UNDERPAD 30X36 HEAVY ABSORB (UNDERPADS AND DIAPERS) ×3 IMPLANT

## 2022-04-02 NOTE — Transfer of Care (Signed)
Immediate Anesthesia Transfer of Care Note  Patient: Patricia Mills  Procedure(s) Performed: Procedure(s) (LRB): HALLUX METATARSOPHALANGEAL FUSION (Right) HAMMER TOE CORRECTION (Right)  Patient Location: PACU  Anesthesia Type: MAC  Level of Consciousness: awake, sedated, patient cooperative and responds to stimulation  Airway & Oxygen Therapy: Patient Spontanous Breathing and Patient on RA  Post-op Assessment: Report given to PACU RN, Post -op Vital signs reviewed and stable and Patient moving all extremities  Post vital signs: Reviewed and stable  Complications: No apparent anesthesia complications

## 2022-04-03 DIAGNOSIS — M2011 Hallux valgus (acquired), right foot: Secondary | ICD-10-CM

## 2022-04-03 DIAGNOSIS — M2041 Other hammer toe(s) (acquired), right foot: Secondary | ICD-10-CM

## 2022-04-03 DIAGNOSIS — M24574 Contracture, right foot: Secondary | ICD-10-CM

## 2022-04-05 ENCOUNTER — Telehealth: Payer: Self-pay | Admitting: *Deleted

## 2022-04-05 ENCOUNTER — Encounter (HOSPITAL_BASED_OUTPATIENT_CLINIC_OR_DEPARTMENT_OTHER): Payer: Self-pay | Admitting: Podiatry

## 2022-04-05 DIAGNOSIS — Z794 Long term (current) use of insulin: Secondary | ICD-10-CM | POA: Diagnosis not present

## 2022-04-05 DIAGNOSIS — Z946 Bone transplant status: Secondary | ICD-10-CM | POA: Diagnosis not present

## 2022-04-05 DIAGNOSIS — Z7982 Long term (current) use of aspirin: Secondary | ICD-10-CM | POA: Diagnosis not present

## 2022-04-05 DIAGNOSIS — Z981 Arthrodesis status: Secondary | ICD-10-CM | POA: Diagnosis not present

## 2022-04-05 DIAGNOSIS — Z4789 Encounter for other orthopedic aftercare: Secondary | ICD-10-CM | POA: Diagnosis not present

## 2022-04-05 DIAGNOSIS — Z7984 Long term (current) use of oral hypoglycemic drugs: Secondary | ICD-10-CM | POA: Diagnosis not present

## 2022-04-07 ENCOUNTER — Encounter: Payer: Self-pay | Admitting: Medical-Surgical

## 2022-04-08 ENCOUNTER — Ambulatory Visit (INDEPENDENT_AMBULATORY_CARE_PROVIDER_SITE_OTHER): Payer: PPO

## 2022-04-08 ENCOUNTER — Ambulatory Visit (INDEPENDENT_AMBULATORY_CARE_PROVIDER_SITE_OTHER): Payer: PPO | Admitting: Podiatry

## 2022-04-08 DIAGNOSIS — M19071 Primary osteoarthritis, right ankle and foot: Secondary | ICD-10-CM

## 2022-04-08 DIAGNOSIS — M19072 Primary osteoarthritis, left ankle and foot: Secondary | ICD-10-CM | POA: Diagnosis not present

## 2022-04-08 DIAGNOSIS — M2041 Other hammer toe(s) (acquired), right foot: Secondary | ICD-10-CM

## 2022-04-08 MED ORDER — ESCITALOPRAM OXALATE 10 MG PO TABS: 10.0000 mg | ORAL_TABLET | Freq: Every day | ORAL | 1 refills | Status: AC

## 2022-04-08 MED ORDER — DOXYCYCLINE HYCLATE 100 MG PO TABS: 100.0000 mg | ORAL_TABLET | Freq: Two times a day (BID) | ORAL | 0 refills | Status: AC

## 2022-04-11 NOTE — Progress Notes (Signed)
  Subjective:  Patient ID: Patricia Mills, female    DOB: June 03, 1946,  MRN: [id number]  Chief Complaint  Patient presents with   Routine Post Op      POV #1 DOS 04/02/2022 FUSION OF GREAT TOE JOINT, 2ND HAMMERTOE CORRECTION RT FOOT, BONE GRAFT RIGHT HEEL      76 y.o. female returns for post-op check.  Overall she is doing well she is having some swelling pain is improving  Review of Systems: Negative except as noted in the HPI. Denies N/V/F/Ch.   Objective:  There were no vitals filed for this visit. There is no height or weight on file to calculate BMI. Constitutional Well developed. Well nourished.  Vascular Foot warm and well perfused. Capillary refill normal to all digits.  Calf is soft and supple, no posterior calf or knee pain, negative Homans' sign  Neurologic Normal speech. Oriented to person, place, and time. Epicritic sensation to light touch grossly present bilaterally.  Dermatologic Incision appears to be healing well and is well coapted.  There is some peri-incisional erythema extending beyond 1 cm but no ascending cellulitis purulent drainage or dehiscence  Orthopedic: Tenderness to palpation noted about the surgical site.   Multiple view plain film radiographs: Good correction of hallux valgus and hammertoe deformities, all hardware intact and equivalent to immediate postoperative films Assessment:   1. 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 Hammertoe of right foot    Plan:  Patient was evaluated and treated and all questions answered.  S/p foot surgery right -Progressing as expected post-operatively. -XR: Noted above -WB Status: NWB in CAM boot.  May be WBAT beginning in 10 days -Sutures: Removed at next visit. -Medications: Rx of doxycycline sent to pharmacy as a precaution due to the erythema which I suspect is likely due to inflammation and swelling. -Foot redressed.  Return in about 2 weeks (around 04/22/2022) for  suture removal, post op (no x-rays).

## 2022-04-22 ENCOUNTER — Ambulatory Visit (INDEPENDENT_AMBULATORY_CARE_PROVIDER_SITE_OTHER): Payer: PPO | Admitting: Podiatry

## 2022-04-22 DIAGNOSIS — M19072 Primary osteoarthritis, left ankle and foot: Secondary | ICD-10-CM

## 2022-04-22 DIAGNOSIS — M19071 Primary osteoarthritis, right ankle and foot: Secondary | ICD-10-CM

## 2022-04-23 ENCOUNTER — Telehealth: Payer: Self-pay | Admitting: *Deleted

## 2022-04-23 NOTE — Telephone Encounter (Signed)
Patient is wanting to know if she is supposed to start Home health.  Patricia Mills called her this morning about  scheduling a visit but she told them not to come until she speaks with physician, doesn't remember it being mentioned during visit yesterday, not seeing a referral in epic. Please advise.

## 2022-04-24 NOTE — Progress Notes (Signed)
  Subjective:  Patient ID: Patricia Mills, female    DOB: [birth year],  MRN: [id number]  Chief Complaint  Patient presents with   Post-op Follow-up    Post op follow-up hammertoe       76 y.o. female returns for post-op check.  Doing well her pain is improving she still satisfied with the result so far  Review of Systems: Negative except as noted in the HPI. Denies N/V/F/Ch.   Objective:  There were no vitals filed for this visit. There is no height or weight on file to calculate BMI. Constitutional Well developed. Well nourished.  Vascular Foot warm and well perfused. Capillary refill normal to all digits.  Calf is soft and supple, no posterior calf or knee pain, negative Homans' sign  Neurologic Normal speech. Oriented to person, place, and time. Epicritic sensation to light touch grossly present bilaterally.  Dermatologic Incision is well-healed, nonhypertrophic.  No signs of infection  Orthopedic: Tenderness to palpation noted about the surgical site.   Multiple view plain film radiographs: Good correction of hallux valgus and hammertoe deformities, all hardware intact and equivalent to immediate postoperative films Assessment:   1. 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  S/p foot surgery right -Overall doing well.  May be WBAT in the cam boot at this point.  There are no signs of infection.  No further antibiotics necessary.  Return in about 3 weeks (around 05/13/2022) for post op (new x-rays); pin removal.

## 2022-04-27 NOTE — Telephone Encounter (Signed)
Spoke with patient and told that per physician it was fine to do PT, verbalized understanding.

## 2022-05-13 ENCOUNTER — Ambulatory Visit (INDEPENDENT_AMBULATORY_CARE_PROVIDER_SITE_OTHER): Payer: PPO

## 2022-05-13 ENCOUNTER — Ambulatory Visit (INDEPENDENT_AMBULATORY_CARE_PROVIDER_SITE_OTHER): Payer: PPO | Admitting: Podiatry

## 2022-05-13 DIAGNOSIS — M19072 Primary osteoarthritis, left ankle and foot: Secondary | ICD-10-CM

## 2022-05-13 DIAGNOSIS — M21611 Bunion of right foot: Secondary | ICD-10-CM

## 2022-05-13 DIAGNOSIS — M2041 Other hammer toe(s) (acquired), right foot: Secondary | ICD-10-CM | POA: Diagnosis not present

## 2022-05-13 DIAGNOSIS — M19071 Primary osteoarthritis, right ankle and foot: Secondary | ICD-10-CM | POA: Diagnosis not present

## 2022-05-13 DIAGNOSIS — M21612 Bunion of left foot: Secondary | ICD-10-CM | POA: Diagnosis not present

## 2022-05-16 NOTE — Progress Notes (Signed)
  Subjective:  Patient ID: Patricia Mills, female    DOB: Jan 02, 1946,  MRN: [id number]  Chief Complaint  Patient presents with   Routine Post Op    POV #3 DOS 04/02/2022 FUSION OF GREAT TOE JOINT, 2ND HAMMERTOE CORRECTION RT FOOT, BONE GRAFT RIGHT HEEL      76 y.o. female returns for post-op check.   Review of Systems: Negative except as noted in the HPI. Denies N/V/F/Ch.   Objective:  There were no vitals filed for this visit. There is no height or weight on file to calculate BMI. Constitutional Well developed. Well nourished.  Vascular Foot warm and well perfused. Capillary refill normal to all digits.  Calf is soft and supple, no posterior calf or knee pain, negative Homans' sign  Neurologic Normal speech. Oriented to person, place, and time. Epicritic sensation to light touch grossly present bilaterally.  Dermatologic Incision is well-healed, nonhypertrophic.  No signs of infection  Orthopedic: Tenderness to palpation noted about the surgical site.   Multiple view plain film radiographs: Correction and alignment maintained there is good bridging across the  arthrodesis site Assessment:   1. Hammertoe of right foot   2. 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 Bilateral bunions     Plan:  Patient was evaluated and treated and all questions answered.  S/p foot surgery right -Overall doing well.  We reviewed today's radiographs.  The pin was removed uneventfully.  She may continue WBAT in the cam boot.  I will see her back in 1 month for new x-rays and transition to shoe gear at that point  Return in about 5 weeks (around 06/17/2022) for post op (new x-rays).

## 2022-06-02 ENCOUNTER — Encounter: Payer: Self-pay | Admitting: General Practice

## 2022-06-15 DIAGNOSIS — F321 Major depressive disorder, single episode, moderate: Secondary | ICD-10-CM | POA: Diagnosis not present

## 2022-06-15 DIAGNOSIS — Z794 Long term (current) use of insulin: Secondary | ICD-10-CM | POA: Diagnosis not present

## 2022-06-15 DIAGNOSIS — Z1231 Encounter for screening mammogram for malignant neoplasm of breast: Secondary | ICD-10-CM | POA: Diagnosis not present

## 2022-06-15 DIAGNOSIS — J4520 Mild intermittent asthma, uncomplicated: Secondary | ICD-10-CM | POA: Diagnosis not present

## 2022-06-15 DIAGNOSIS — E119 Type 2 diabetes mellitus without complications: Secondary | ICD-10-CM | POA: Diagnosis not present

## 2022-06-15 DIAGNOSIS — Z780 Asymptomatic menopausal state: Secondary | ICD-10-CM | POA: Diagnosis not present

## 2022-06-22 ENCOUNTER — Ambulatory Visit (INDEPENDENT_AMBULATORY_CARE_PROVIDER_SITE_OTHER): Payer: PPO | Admitting: Podiatry

## 2022-06-22 ENCOUNTER — Ambulatory Visit (INDEPENDENT_AMBULATORY_CARE_PROVIDER_SITE_OTHER): Payer: PPO

## 2022-06-22 DIAGNOSIS — M2041 Other hammer toe(s) (acquired), right foot: Secondary | ICD-10-CM

## 2022-06-22 DIAGNOSIS — M21611 Bunion of right foot: Secondary | ICD-10-CM

## 2022-06-22 DIAGNOSIS — M21612 Bunion of left foot: Secondary | ICD-10-CM

## 2022-06-22 DIAGNOSIS — M19071 Primary osteoarthritis, right ankle and foot: Secondary | ICD-10-CM

## 2022-06-22 DIAGNOSIS — M19072 Primary osteoarthritis, left ankle and foot: Secondary | ICD-10-CM

## 2022-06-22 NOTE — Progress Notes (Signed)
  Subjective:  Patient ID: Patricia Mills, female    DOB: 02/27/1946,  MRN: [id number]  Chief Complaint  Patient presents with   Routine Post Op    POV #3 DOS 04/02/2022 FUSION OF GREAT TOE JOINT, 2ND HAMMERTOE CORRECTION RT FOOT, BONE GRAFT RIGHT HEEL. 5 weeks (around 06/17/2022) for post op- Patient states she has discomfort at times and swelling. Patient states she is doing well overall.       76 y.o. female returns for post-op check.  Overall doing much better she has occasional pain but its not every day   Review of Systems: Negative except as noted in the HPI. Denies N/V/F/Ch.   Objective:  There were no vitals filed for this visit. There is no height or weight on file to calculate BMI. Constitutional Well developed. Well nourished.  Vascular Foot warm and well perfused. Capillary refill normal to all digits.  Calf is soft and supple, no posterior calf or knee pain, negative Homans' sign  Neurologic Normal speech. Oriented to person, place, and time. Epicritic sensation to light touch grossly present bilaterally.  Dermatologic Incision is well-healed, nonhypertrophic.   Orthopedic: She has very little edema and no tenderness to palpation noted about the surgical site.   Multiple view plain film radiographs: Full healing of arthrodesis site of second toe and first MTPJ Assessment:   1. Hammertoe of right foot   2. 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 Bilateral bunions     Plan:  Patient was evaluated and treated and all questions answered.  S/p foot surgery right -Overall doing well.  She may continue to be WBAT in regular shoe gear.  Activity as tolerated.  Discussed with her it may take a few more months for the intermittent pains and swelling to go away but overall she is doing very well.  I would like to see her back as needed or when she is ready to address the left foot.  Return if symptoms worsen or fail to improve.

## 2022-07-23 DIAGNOSIS — R0609 Other forms of dyspnea: Secondary | ICD-10-CM | POA: Diagnosis not present

## 2022-07-23 DIAGNOSIS — R002 Palpitations: Secondary | ICD-10-CM | POA: Diagnosis not present

## 2022-07-23 DIAGNOSIS — R9431 Abnormal electrocardiogram [ECG] [EKG]: Secondary | ICD-10-CM | POA: Diagnosis not present

## 2022-07-23 DIAGNOSIS — R600 Localized edema: Secondary | ICD-10-CM | POA: Diagnosis not present

## 2022-07-23 DIAGNOSIS — Z23 Encounter for immunization: Secondary | ICD-10-CM | POA: Diagnosis not present

## 2022-07-23 DIAGNOSIS — R011 Cardiac murmur, unspecified: Secondary | ICD-10-CM | POA: Diagnosis not present

## 2022-07-26 DIAGNOSIS — R600 Localized edema: Secondary | ICD-10-CM | POA: Diagnosis not present

## 2022-08-03 DIAGNOSIS — R600 Localized edema: Secondary | ICD-10-CM | POA: Diagnosis not present

## 2022-08-04 DIAGNOSIS — R0609 Other forms of dyspnea: Secondary | ICD-10-CM | POA: Diagnosis not present

## 2022-08-04 DIAGNOSIS — R0789 Other chest pain: Secondary | ICD-10-CM | POA: Diagnosis not present

## 2022-08-04 DIAGNOSIS — R011 Cardiac murmur, unspecified: Secondary | ICD-10-CM | POA: Diagnosis not present

## 2022-08-04 DIAGNOSIS — I00 Rheumatic fever without heart involvement: Secondary | ICD-10-CM | POA: Diagnosis not present

## 2022-08-04 DIAGNOSIS — R9431 Abnormal electrocardiogram [ECG] [EKG]: Secondary | ICD-10-CM | POA: Diagnosis not present

## 2022-08-04 DIAGNOSIS — R002 Palpitations: Secondary | ICD-10-CM | POA: Diagnosis not present

## 2022-08-08 DIAGNOSIS — B9689 Other specified bacterial agents as the cause of diseases classified elsewhere: Secondary | ICD-10-CM | POA: Diagnosis not present

## 2022-08-08 DIAGNOSIS — J9801 Acute bronchospasm: Secondary | ICD-10-CM | POA: Diagnosis not present

## 2022-08-08 DIAGNOSIS — Z20822 Contact with and (suspected) exposure to covid-19: Secondary | ICD-10-CM | POA: Diagnosis not present

## 2022-08-08 DIAGNOSIS — J208 Acute bronchitis due to other specified organisms: Secondary | ICD-10-CM | POA: Diagnosis not present

## 2022-08-17 DIAGNOSIS — R002 Palpitations: Secondary | ICD-10-CM | POA: Diagnosis not present

## 2022-08-17 DIAGNOSIS — R9431 Abnormal electrocardiogram [ECG] [EKG]: Secondary | ICD-10-CM | POA: Diagnosis not present

## 2022-09-15 ENCOUNTER — Telehealth: Payer: Self-pay | Admitting: General Practice

## 2022-09-15 NOTE — Telephone Encounter (Signed)
Left message for patient to call and schedule annual/medicare wellness visit.

## 2022-09-16 DIAGNOSIS — R0789 Other chest pain: Secondary | ICD-10-CM | POA: Diagnosis not present

## 2022-09-16 DIAGNOSIS — R9431 Abnormal electrocardiogram [ECG] [EKG]: Secondary | ICD-10-CM | POA: Diagnosis not present

## 2022-09-16 DIAGNOSIS — R0609 Other forms of dyspnea: Secondary | ICD-10-CM | POA: Diagnosis not present

## 2022-09-17 DIAGNOSIS — R011 Cardiac murmur, unspecified: Secondary | ICD-10-CM | POA: Diagnosis not present

## 2022-09-17 DIAGNOSIS — R0789 Other chest pain: Secondary | ICD-10-CM | POA: Diagnosis not present

## 2022-09-17 DIAGNOSIS — R9431 Abnormal electrocardiogram [ECG] [EKG]: Secondary | ICD-10-CM | POA: Diagnosis not present

## 2022-09-17 DIAGNOSIS — R002 Palpitations: Secondary | ICD-10-CM | POA: Diagnosis not present

## 2022-09-20 DIAGNOSIS — R931 Abnormal findings on diagnostic imaging of heart and coronary circulation: Secondary | ICD-10-CM | POA: Diagnosis not present

## 2022-09-23 DIAGNOSIS — I25118 Atherosclerotic heart disease of native coronary artery with other forms of angina pectoris: Secondary | ICD-10-CM | POA: Diagnosis not present

## 2022-09-28 DIAGNOSIS — Z79899 Other long term (current) drug therapy: Secondary | ICD-10-CM | POA: Diagnosis not present

## 2022-09-28 DIAGNOSIS — R931 Abnormal findings on diagnostic imaging of heart and coronary circulation: Secondary | ICD-10-CM | POA: Diagnosis not present

## 2022-09-28 DIAGNOSIS — Z880 Allergy status to penicillin: Secondary | ICD-10-CM | POA: Diagnosis not present

## 2022-09-28 DIAGNOSIS — Z7982 Long term (current) use of aspirin: Secondary | ICD-10-CM | POA: Diagnosis not present

## 2022-09-28 DIAGNOSIS — I25118 Atherosclerotic heart disease of native coronary artery with other forms of angina pectoris: Secondary | ICD-10-CM | POA: Diagnosis not present

## 2022-09-28 DIAGNOSIS — Z7984 Long term (current) use of oral hypoglycemic drugs: Secondary | ICD-10-CM | POA: Diagnosis not present

## 2022-09-28 DIAGNOSIS — Z888 Allergy status to other drugs, medicaments and biological substances status: Secondary | ICD-10-CM | POA: Diagnosis not present

## 2022-09-28 DIAGNOSIS — I2510 Atherosclerotic heart disease of native coronary artery without angina pectoris: Secondary | ICD-10-CM | POA: Diagnosis not present

## 2022-09-28 DIAGNOSIS — R002 Palpitations: Secondary | ICD-10-CM | POA: Diagnosis not present

## 2022-09-28 DIAGNOSIS — E119 Type 2 diabetes mellitus without complications: Secondary | ICD-10-CM | POA: Diagnosis not present

## 2022-09-28 DIAGNOSIS — Z794 Long term (current) use of insulin: Secondary | ICD-10-CM | POA: Diagnosis not present

## 2022-12-30 ENCOUNTER — Other Ambulatory Visit: Payer: Self-pay | Admitting: Medical-Surgical

## 2023-07-14 ENCOUNTER — Ambulatory Visit: Payer: PPO | Admitting: Podiatry

## 2023-12-29 ENCOUNTER — Ambulatory Visit: Admitting: Podiatry

## 2023-12-29 ENCOUNTER — Encounter: Payer: Self-pay | Admitting: Podiatry

## 2023-12-29 DIAGNOSIS — M2041 Other hammer toe(s) (acquired), right foot: Secondary | ICD-10-CM

## 2023-12-29 DIAGNOSIS — E1142 Type 2 diabetes mellitus with diabetic polyneuropathy: Secondary | ICD-10-CM | POA: Diagnosis not present

## 2023-12-29 DIAGNOSIS — Z794 Long term (current) use of insulin: Secondary | ICD-10-CM

## 2023-12-29 DIAGNOSIS — M21612 Bunion of left foot: Secondary | ICD-10-CM | POA: Diagnosis not present

## 2023-12-29 NOTE — Progress Notes (Signed)
  Subjective:  Patient ID: Patricia Mills, female    DOB: 20-Oct-1945,   MRN: [id number]  No chief complaint on file.   78 y.o. female presents for concern of burning and tingling pain in her right foot. Has a history of right foot first MPJ fusion and hammertoe repair on the right. Relates since then she has had pain in the foot. Recently got some new shoes and inserts that has helped. She does relate difficulty in dress shoes.  Relates burning and tingling in their feet. Patient is diabetic and last A1c was  Lab Results  Component Value Date   HGBA1C 6.9 03/24/2022   .   PCP:  Christen Butter, NP    . Denies any other pedal complaints. Denies n/v/f/c.   Past Medical History:  Diagnosis Date   Arthritis    both knees   Depression    Diverticulitis    Heart murmur    mild, no cardiologist   History of colon polyps    History of kidney stones    Hypertension    Thyroid disease    Type 2 Diabetes mellitus without complication (HCC)    Wears glasses     Objective:  Physical Exam: Vascular: DP/PT pulses 2/4 bilateral. CFT <3 seconds. Normal hair growth on digits. No edema.  Skin. No lacerations or abrasions bilateral feet.  Musculoskeletal: MMT 5/5 bilateral lower extremities in DF, PF, Inversion and Eversion. Deceased ROM in DF of ankle joint. 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 Tender over first interspace in area of previous surgery Neurological: Sensation intact to light touch. Protective sensation slighlty diminished.   Assessment:   1. Type 2 diabetes mellitus with diabetic polyneuropathy, with long-term current use of insulin (HCC)   2. Bunion of left foot   3. Hammertoe of right foot      Plan:  Patient was evaluated and treated and all questions answered. -Discussed and educated patient on diabetic foot care, especially with  regards to the vascular, neurological and musculoskeletal systems.  -Stressed the importance of good glycemic control and the  detriment of not  controlling glucose levels in relation to the foot. -Discussed supportive shoes at all times and checking feet regularly.  -DM shoes ordered.  -Answered all patient questions -Patient to return  in 1 year for DM foot chceck.  -Patient advised to call the office if any problems or questions arise in the meantime.   Louann Sjogren, DPM

## 2024-06-12 ENCOUNTER — Encounter: Payer: Self-pay | Admitting: Sports Medicine
